# Patient Record
Sex: Male | Born: 1990 | Race: Black or African American | Hispanic: No | Marital: Single
Health system: Southern US, Community
[De-identification: ages and names within clinical notes are randomized; demographics above are authoritative.]

## PROBLEM LIST (undated history)

## (undated) DIAGNOSIS — F1010 Alcohol abuse, uncomplicated: Secondary | ICD-10-CM

---

## 2017-02-15 ENCOUNTER — Emergency Department (HOSPITAL_COMMUNITY)
Admission: EM | Admit: 2017-02-15 | Discharge: 2017-02-15 | Disposition: A | Discharge status: Home / Self Care | Payer: Self-pay | Attending: Emergency Medicine | Admitting: Emergency Medicine

## 2017-02-15 ENCOUNTER — Encounter (HOSPITAL_COMMUNITY): Payer: Self-pay

## 2017-02-15 DIAGNOSIS — F10920 Alcohol use, unspecified with intoxication, uncomplicated: Secondary | ICD-10-CM | POA: Insufficient documentation

## 2017-02-15 MED ORDER — AMMONIA AROMATIC IN INHA
RESPIRATORY_TRACT | Status: AC
  Filled 2017-02-15: qty 10

## 2017-02-15 NOTE — ED Triage Notes (Addendum)
Per EMS, they were called to the [facility] on [address] d/t Pt sleeping at a table.  Pt admits to "drinking 1.5 Tags and smoking a blunt."  Denies pain.  Pt very lethargic.   Unable to keep Pt awake long enough to complete history assessment.

## 2017-02-15 NOTE — ED Provider Notes (Signed)
  [facility] DEPT Provider Note   CSN: [id number] Arrival date & time: 02/15/17  0407     History   Chief Complaint Chief Complaint  Patient presents with  . Alcohol Intoxication  . Marijuana Use    HPI David Burnett is a 26 y.o. male.  The history is provided by the EMS personnel. The history is limited by the condition of the patient.  Alcohol Intoxication  This is a new problem. The current episode started 3 to 5 hours ago. The problem occurs constantly. The problem has not changed since onset.Pertinent negatives include no chest pain, no headaches and no shortness of breath. Nothing aggravates the symptoms. Nothing relieves the symptoms. He has tried nothing for the symptoms. The treatment provided no relief.    History reviewed. No pertinent past medical history.  There are no active problems to display for this patient.   History reviewed. No pertinent surgical history.     Home Medications    Prior to Admission medications   Not on File    Family History No family history on file.  Social History Social History  Substance Use Topics  . Smoking status: Unknown If Ever Smoked  . Smokeless tobacco: Never Used  . Alcohol use Yes     Allergies   Patient has no allergy information on record.   Review of Systems Review of Systems  Eyes: Negative for photophobia.  Respiratory: Negative for shortness of breath.   Cardiovascular: Negative for chest pain.  Neurological: Negative for headaches.  All other systems reviewed and are negative.    Physical Exam Updated Vital Signs BP (!) 113/58 (BP Location: Left Arm)   Pulse 77   Temp 97.6 F (36.4 C) (Axillary)   Resp 20   SpO2 98%   Physical Exam  Constitutional: He appears well-developed and well-nourished. No distress.  HENT:  Head: Normocephalic and atraumatic.  Nose: Nose normal.  Mouth/Throat: No oropharyngeal exudate.  Eyes: Pupils are equal, round, and reactive to light. Conjunctivae  are normal.  Neck: Normal range of motion. Neck supple.  Cardiovascular: Normal rate, regular rhythm, normal heart sounds and intact distal pulses.   Pulmonary/Chest: Effort normal and breath sounds normal. He has no wheezes. He has no rales.  Abdominal: Soft. Bowel sounds are normal. He exhibits no mass. There is no tenderness. There is no rebound and no guarding.  Musculoskeletal: Normal range of motion.  Neurological: He is alert.  Skin: Skin is warm and dry. Capillary refill takes less than 2 seconds.  Psychiatric: He has a normal mood and affect.     ED Treatments / Results   Vitals:   02/15/17 0434  BP: (!) 113/58  Pulse: 77  Resp: 20  Temp: 97.6 F (36.4 C)  SpO2: 98%    Procedures Procedures (including critical care time)  Medications Ordered in ED Medications  ammonia inhalant (not administered)      Final Clinical Impressions(s) / ED Diagnoses  Patient will need to be alert and ambulate and tolerate oral liquids.     Missouri Lapaglia, MD 02/15/17 [phone]

## 2017-02-15 NOTE — ED Notes (Signed)
Assisted pt to restroom with unsteady gait noted and back to bed bed rails up.

## 2017-03-12 ENCOUNTER — Emergency Department (HOSPITAL_COMMUNITY): Payer: Self-pay

## 2017-03-12 ENCOUNTER — Emergency Department (HOSPITAL_COMMUNITY)
Admission: EM | Admit: 2017-03-12 | Discharge: 2017-03-12 | Disposition: A | Discharge status: Home / Self Care | Payer: Self-pay | Attending: Emergency Medicine | Admitting: Emergency Medicine

## 2017-03-12 ENCOUNTER — Encounter (HOSPITAL_COMMUNITY): Payer: Self-pay

## 2017-03-12 DIAGNOSIS — Y999 Unspecified external cause status: Secondary | ICD-10-CM | POA: Insufficient documentation

## 2017-03-12 DIAGNOSIS — Y9302 Activity, running: Secondary | ICD-10-CM | POA: Insufficient documentation

## 2017-03-12 DIAGNOSIS — S6992XA Unspecified injury of left wrist, hand and finger(s), initial encounter: Secondary | ICD-10-CM | POA: Insufficient documentation

## 2017-03-12 DIAGNOSIS — Y929 Unspecified place or not applicable: Secondary | ICD-10-CM | POA: Insufficient documentation

## 2017-03-12 DIAGNOSIS — W010XXA Fall on same level from slipping, tripping and stumbling without subsequent striking against object, initial encounter: Secondary | ICD-10-CM | POA: Insufficient documentation

## 2017-03-12 MED ORDER — IBUPROFEN 800 MG PO TABS: 800.0000 mg | ORAL_TABLET | Freq: Three times a day (TID) | ORAL | 0 refills | Status: DC

## 2017-03-12 NOTE — ED Triage Notes (Signed)
Patient complains of left wrist pain after falling last night, pain with ROM, no deformity noted

## 2017-03-12 NOTE — ED Provider Notes (Signed)
[facility] EMERGENCY DEPARTMENT Provider Note   CSN: [id number] Arrival date & time: 03/12/17  1217  History   Chief Complaint No chief complaint on file.   HPI David Burnett is a 26 y.o. male.  HPI   Patient comes to the ER with complaints of left wrist injury after falling on it while running to catch the bus and fell on it. He has been drinking milk for the pain but it has not been helping. He has not tried any medication. He works at [employer] and [employer] and uses both hands, he is right hand dominant. He is tender t the outside and has pain with moving his wrist. No weakness or numbness. No laceration or abrasion.  History reviewed. No pertinent past medical history.  There are no active problems to display for this patient.   History reviewed. No pertinent surgical history.     Home Medications    Prior to Admission medications   Medication Sig Start Date End Date Taking? Authorizing Provider  ibuprofen (ADVIL,MOTRIN) 800 MG tablet Take 1 tablet (800 mg total) by mouth 3 (three) times daily. 03/12/17   Delos Haring, PA-C    Family History No family history on file.  Social History Social History  Substance Use Topics  . Smoking status: Unknown If Ever Smoked  . Smokeless tobacco: Never Used  . Alcohol use Yes     Allergies   Patient has no known allergies.   Review of Systems Review of Systems  Negative ROS aside from pertinent positives and negatives as listed in HPI  Physical Exam Updated Vital Signs BP 122/71 (BP Location: Left Arm)   Pulse 89   Temp (!) 97.5 F (36.4 C) (Oral)   Resp 20   SpO2 100%   Physical Exam  Constitutional: He appears well-developed and well-nourished. No distress.  HENT:  Head: Normocephalic and atraumatic.  Eyes: Pupils are equal, round, and reactive to light.  Neck: Normal range of motion. Neck supple.  Cardiovascular: Normal rate and regular rhythm.   Pulmonary/Chest: Effort normal.   Abdominal: Soft.  Musculoskeletal:       Left wrist: He exhibits decreased range of motion, tenderness, bony tenderness and swelling. He exhibits no effusion, no crepitus, no deformity and no laceration.  Intact sensation, strong radial pulse. He does have pain with range of motion   Neurological: He is alert.  Skin: Skin is warm and dry.  Nursing note and vitals reviewed.   ED Treatments / Results  Labs (all labs ordered are listed, but only abnormal results are displayed) Labs Reviewed - No data to display  EKG  EKG Interpretation None       Radiology Dg Wrist Complete Left  Result Date: 03/12/2017 CLINICAL DATA:  Radial wrist pain after fall. EXAM: LEFT WRIST - COMPLETE 3+ VIEW COMPARISON:  None. FINDINGS: There is no evidence of fracture or dislocation. There is no evidence of arthropathy or other focal bone abnormality. Lunotriquetral coalition. Soft tissues are unremarkable. IMPRESSION: Negative. If there is continued clinical concern for occult scaphoid fracture, recommend follow up x-rays in 10-14 days. Electronically Signed   By: Titus Dubin M.D.   On: 03/12/2017 12:57    Procedures Procedures (including critical care time)  Medications Ordered in ED Medications - No data to display   Initial Impression / Assessment and Plan / ED Course  I have reviewed the triage vital signs and the nursing notes.  Pertinent labs & imaging results that were available  during my care of the patient were reviewed by me and considered in my medical decision making (see chart for details).    No pain to the snuff box region. xrays are negative for fracture. Will place In wrist splint and refer to Ortho. Recommend RICE as well as Tylenol or Ibuprofen.  Blood pressure 122/71, pulse 89, temperature (!) 97.5 F (36.4 C), temperature source Oral, resp. rate 20, SpO2 100 %.  David Burnett has been evaluated today in the emergency department. The appropriate screening and testing was  been performed and I believe the patient to be medically stable for discharge.   Return signs and symptoms have been discussed with the patient and/or caregivers and they have voiced their understanding. The patient has agreed to follow-up with their primary care provider or the referred specialist.   Final Clinical Impressions(s) / ED Diagnoses   Final diagnoses:  Injury of left wrist, initial encounter    New Prescriptions New Prescriptions   IBUPROFEN (ADVIL,MOTRIN) 800 MG TABLET    Take 1 tablet (800 mg total) by mouth 3 (three) times daily.     Delos Haring, PA-C 03/12/17 1557    Lajean Saver, MD 03/13/17 [phone]

## 2017-03-20 ENCOUNTER — Other Ambulatory Visit (HOSPITAL_COMMUNITY): Payer: Self-pay

## 2017-03-20 ENCOUNTER — Observation Stay (HOSPITAL_COMMUNITY)
Admission: EM | Admit: 2017-03-20 | Discharge: 2017-03-21 | Disposition: A | Discharge status: Home / Self Care | Payer: Self-pay | Attending: Internal Medicine | Admitting: Internal Medicine

## 2017-03-20 ENCOUNTER — Emergency Department (HOSPITAL_COMMUNITY): Payer: Self-pay

## 2017-03-20 ENCOUNTER — Observation Stay (HOSPITAL_COMMUNITY): Payer: Self-pay

## 2017-03-20 ENCOUNTER — Other Ambulatory Visit: Payer: Self-pay

## 2017-03-20 ENCOUNTER — Encounter (HOSPITAL_COMMUNITY): Payer: Self-pay | Admitting: Emergency Medicine

## 2017-03-20 DIAGNOSIS — G92 Toxic encephalopathy: Secondary | ICD-10-CM | POA: Insufficient documentation

## 2017-03-20 DIAGNOSIS — M6282 Rhabdomyolysis: Secondary | ICD-10-CM | POA: Insufficient documentation

## 2017-03-20 DIAGNOSIS — F17210 Nicotine dependence, cigarettes, uncomplicated: Secondary | ICD-10-CM | POA: Insufficient documentation

## 2017-03-20 DIAGNOSIS — F10929 Alcohol use, unspecified with intoxication, unspecified: Secondary | ICD-10-CM

## 2017-03-20 DIAGNOSIS — E872 Acidosis: Secondary | ICD-10-CM | POA: Insufficient documentation

## 2017-03-20 DIAGNOSIS — F10129 Alcohol abuse with intoxication, unspecified: Secondary | ICD-10-CM | POA: Insufficient documentation

## 2017-03-20 DIAGNOSIS — Z7982 Long term (current) use of aspirin: Secondary | ICD-10-CM | POA: Insufficient documentation

## 2017-03-20 DIAGNOSIS — T510X1A Toxic effect of ethanol, accidental (unintentional), initial encounter: Principal | ICD-10-CM | POA: Insufficient documentation

## 2017-03-20 DIAGNOSIS — Z79899 Other long term (current) drug therapy: Secondary | ICD-10-CM | POA: Insufficient documentation

## 2017-03-20 DIAGNOSIS — I959 Hypotension, unspecified: Secondary | ICD-10-CM

## 2017-03-20 DIAGNOSIS — G9340 Encephalopathy, unspecified: Secondary | ICD-10-CM | POA: Diagnosis present

## 2017-03-20 DIAGNOSIS — D1802 Hemangioma of intracranial structures: Secondary | ICD-10-CM | POA: Insufficient documentation

## 2017-03-20 DIAGNOSIS — Y9289 Other specified places as the place of occurrence of the external cause: Secondary | ICD-10-CM | POA: Insufficient documentation

## 2017-03-20 LAB — GLUCOSE, CAPILLARY
GLUCOSE-CAPILLARY: 157 mg/dL — AB (ref 65–99)
GLUCOSE-CAPILLARY: 91 mg/dL (ref 65–99)
Glucose-Capillary: 116 mg/dL — ABNORMAL HIGH (ref 65–99)
Glucose-Capillary: 160 mg/dL — ABNORMAL HIGH (ref 65–99)
Glucose-Capillary: 77 mg/dL (ref 65–99)
Glucose-Capillary: 79 mg/dL (ref 65–99)

## 2017-03-20 LAB — RAPID URINE DRUG SCREEN, HOSP PERFORMED
Amphetamines: NOT DETECTED
BARBITURATES: NOT DETECTED
BENZODIAZEPINES: NOT DETECTED
Cocaine: NOT DETECTED
Opiates: NOT DETECTED
TETRAHYDROCANNABINOL: NOT DETECTED

## 2017-03-20 LAB — COMPREHENSIVE METABOLIC PANEL
ALK PHOS: 52 U/L (ref 38–126)
ALT: 34 U/L (ref 17–63)
AST: 47 U/L — AB (ref 15–41)
Albumin: 4.5 g/dL (ref 3.5–5.0)
Anion gap: 11 (ref 5–15)
BILIRUBIN TOTAL: 0.5 mg/dL (ref 0.3–1.2)
BUN: 20 mg/dL (ref 6–20)
CO2: 26 mmol/L (ref 22–32)
Calcium: 9.2 mg/dL (ref 8.9–10.3)
Chloride: 105 mmol/L (ref 101–111)
Creatinine, Ser: 1.07 mg/dL (ref 0.61–1.24)
GFR calc Af Amer: >60 mL/min (ref >60)
GFR calc non Af Amer: >60 mL/min (ref >60)
GLUCOSE: 78 mg/dL (ref 65–99)
POTASSIUM: 3.8 mmol/L (ref 3.5–5.1)
Sodium: 142 mmol/L (ref 135–145)
TOTAL PROTEIN: 7.8 g/dL (ref 6.5–8.1)

## 2017-03-20 LAB — LACTIC ACID, PLASMA
LACTIC ACID, VENOUS: 3.1 mmol/L — AB (ref 0.5–1.9)
Lactic Acid, Venous: 3 mmol/L (ref 0.5–1.9)

## 2017-03-20 LAB — CBC WITH DIFFERENTIAL/PLATELET
BASOS ABS: 0 10*3/uL (ref 0.0–0.1)
BASOS PCT: 0 %
EOS PCT: 1 %
Eosinophils Absolute: 0.1 10*3/uL (ref 0.0–0.7)
HCT: 41.5 % (ref 39.0–52.0)
Hemoglobin: 13.9 g/dL (ref 13.0–17.0)
LYMPHS PCT: 19 %
Lymphs Abs: 1.5 10*3/uL (ref 0.7–4.0)
MCH: 32.1 pg (ref 26.0–34.0)
MCHC: 33.5 g/dL (ref 30.0–36.0)
MCV: 95.8 fL (ref 78.0–100.0)
Monocytes Absolute: 0.4 10*3/uL (ref 0.1–1.0)
Monocytes Relative: 6 %
Neutro Abs: 5.6 10*3/uL (ref 1.7–7.7)
Neutrophils Relative %: 74 %
PLATELETS: 267 10*3/uL (ref 150–400)
RBC: 4.33 MIL/uL (ref 4.22–5.81)
RDW: 14 % (ref 11.5–15.5)
WBC: 7.7 10*3/uL (ref 4.0–10.5)

## 2017-03-20 LAB — URINALYSIS, ROUTINE W REFLEX MICROSCOPIC
Bilirubin Urine: NEGATIVE
Glucose, UA: NEGATIVE mg/dL
HGB URINE DIPSTICK: NEGATIVE
Ketones, ur: NEGATIVE mg/dL
LEUKOCYTES UA: NEGATIVE
NITRITE: NEGATIVE
PROTEIN: NEGATIVE mg/dL
SPECIFIC GRAVITY, URINE: 1.014 (ref 1.005–1.030)
pH: 5 (ref 5.0–8.0)

## 2017-03-20 LAB — HEPATIC FUNCTION PANEL
ALBUMIN: 3.9 g/dL (ref 3.5–5.0)
ALK PHOS: 46 U/L (ref 38–126)
ALT: 32 U/L (ref 17–63)
AST: 49 U/L — ABNORMAL HIGH (ref 15–41)
BILIRUBIN TOTAL: 0.4 mg/dL (ref 0.3–1.2)
Bilirubin, Direct: <0.1 mg/dL — ABNORMAL LOW (ref 0.1–0.5)
TOTAL PROTEIN: 6.8 g/dL (ref 6.5–8.1)

## 2017-03-20 LAB — MAGNESIUM: Magnesium: 1.8 mg/dL (ref 1.7–2.4)

## 2017-03-20 LAB — CK
CK TOTAL: 1402 U/L — AB (ref 49–397)
Total CK: 930 U/L — ABNORMAL HIGH (ref 49–397)

## 2017-03-20 LAB — CBC
HEMATOCRIT: 37.9 % — AB (ref 39.0–52.0)
HEMOGLOBIN: 12.4 g/dL — AB (ref 13.0–17.0)
MCH: 31.9 pg (ref 26.0–34.0)
MCHC: 32.7 g/dL (ref 30.0–36.0)
MCV: 97.4 fL (ref 78.0–100.0)
Platelets: 254 10*3/uL (ref 150–400)
RBC: 3.89 MIL/uL — ABNORMAL LOW (ref 4.22–5.81)
RDW: 14.2 % (ref 11.5–15.5)
WBC: 7.1 10*3/uL (ref 4.0–10.5)

## 2017-03-20 LAB — TSH: TSH: 0.213 u[IU]/mL — ABNORMAL LOW (ref 0.350–4.500)

## 2017-03-20 LAB — BASIC METABOLIC PANEL
Anion gap: 10 (ref 5–15)
BUN: 26 mg/dL — AB (ref 6–20)
CHLORIDE: 110 mmol/L (ref 101–111)
CO2: 22 mmol/L (ref 22–32)
CREATININE: 1 mg/dL (ref 0.61–1.24)
Calcium: 8.2 mg/dL — ABNORMAL LOW (ref 8.9–10.3)
GFR calc Af Amer: >60 mL/min (ref >60)
GFR calc non Af Amer: >60 mL/min (ref >60)
GLUCOSE: 58 mg/dL — AB (ref 65–99)
POTASSIUM: 3.5 mmol/L (ref 3.5–5.1)
Sodium: 142 mmol/L (ref 135–145)

## 2017-03-20 LAB — SALICYLATE LEVEL: Salicylate Lvl: <7 mg/dL (ref 2.8–30.0)

## 2017-03-20 LAB — HIV ANTIBODY (ROUTINE TESTING W REFLEX): HIV SCREEN 4TH GENERATION: NONREACTIVE

## 2017-03-20 LAB — TROPONIN I

## 2017-03-20 LAB — MRSA PCR SCREENING: MRSA BY PCR: NEGATIVE

## 2017-03-20 LAB — ACETAMINOPHEN LEVEL

## 2017-03-20 LAB — ETHANOL: Alcohol, Ethyl (B): 216 mg/dL — ABNORMAL HIGH (ref <10)

## 2017-03-20 LAB — AMMONIA: Ammonia: 26 umol/L (ref 9–35)

## 2017-03-20 MED ORDER — LORAZEPAM 2 MG/ML IJ SOLN: 1.0000 mg | Freq: Four times a day (QID) | INTRAMUSCULAR | Status: DC | PRN

## 2017-03-20 MED ORDER — LORAZEPAM 1 MG PO TABS: 1.0000 mg | ORAL_TABLET | Freq: Four times a day (QID) | ORAL | Status: DC | PRN

## 2017-03-20 MED ORDER — THIAMINE HCL 100 MG/ML IJ SOLN
100.0000 mg | Freq: Every day | INTRAMUSCULAR | Status: DC
  Administered 2017-03-20: 100 mg via INTRAVENOUS
  Filled 2017-03-20: qty 2

## 2017-03-20 MED ORDER — ONDANSETRON HCL 4 MG PO TABS: 4.0000 mg | ORAL_TABLET | Freq: Four times a day (QID) | ORAL | Status: DC | PRN

## 2017-03-20 MED ORDER — POTASSIUM CHLORIDE CRYS ER 20 MEQ PO TBCR
40.0000 meq | EXTENDED_RELEASE_TABLET | Freq: Once | ORAL | Status: AC
  Administered 2017-03-20: 40 meq via ORAL
  Filled 2017-03-20: qty 2

## 2017-03-20 MED ORDER — SODIUM CHLORIDE 0.9 % IV BOLUS (SEPSIS)
500.0000 mL | Freq: Once | INTRAVENOUS | Status: AC
  Administered 2017-03-20: 500 mL via INTRAVENOUS

## 2017-03-20 MED ORDER — ACETAMINOPHEN 650 MG RE SUPP: 650.0000 mg | Freq: Four times a day (QID) | RECTAL | Status: DC | PRN

## 2017-03-20 MED ORDER — ACETAMINOPHEN 325 MG PO TABS
650.0000 mg | ORAL_TABLET | Freq: Four times a day (QID) | ORAL | Status: DC | PRN
  Administered 2017-03-21: 650 mg via ORAL
  Filled 2017-03-20: qty 2

## 2017-03-20 MED ORDER — DEXTROSE 50 % IV SOLN
25.0000 mL | Freq: Once | INTRAVENOUS | Status: AC
  Administered 2017-03-20: 25 mL via INTRAVENOUS

## 2017-03-20 MED ORDER — VITAMIN B-1 100 MG PO TABS
100.0000 mg | ORAL_TABLET | Freq: Every day | ORAL | Status: DC
  Filled 2017-03-20: qty 1

## 2017-03-20 MED ORDER — SODIUM CHLORIDE 0.9 % IV SOLN
INTRAVENOUS | Status: DC
  Administered 2017-03-20 – 2017-03-21 (×4): via INTRAVENOUS

## 2017-03-20 MED ORDER — SODIUM CHLORIDE 0.9 % IV BOLUS (SEPSIS)
1000.0000 mL | Freq: Once | INTRAVENOUS | Status: AC
  Administered 2017-03-20: 1000 mL via INTRAVENOUS

## 2017-03-20 MED ORDER — FOLIC ACID 1 MG PO TABS
1.0000 mg | ORAL_TABLET | Freq: Every day | ORAL | Status: DC
  Administered 2017-03-20: 1 mg via ORAL
  Filled 2017-03-20: qty 1

## 2017-03-20 MED ORDER — ONDANSETRON HCL 4 MG/2ML IJ SOLN: 4.0000 mg | Freq: Four times a day (QID) | INTRAMUSCULAR | Status: DC | PRN

## 2017-03-20 MED ORDER — ADULT MULTIVITAMIN W/MINERALS CH
1.0000 | ORAL_TABLET | Freq: Every day | ORAL | Status: DC
  Administered 2017-03-20: 1 via ORAL
  Filled 2017-03-20: qty 1

## 2017-03-20 MED ORDER — SODIUM CHLORIDE 0.9 % IV BOLUS (SEPSIS)
2000.0000 mL | Freq: Once | INTRAVENOUS | Status: AC
  Administered 2017-03-20: 2000 mL via INTRAVENOUS

## 2017-03-20 MED ORDER — DEXTROSE 50 % IV SOLN
INTRAVENOUS | Status: AC
  Administered 2017-03-20: 25 mL
  Filled 2017-03-20: qty 50

## 2017-03-20 NOTE — ED Triage Notes (Signed)
Patient BIB [facility]. Pt was found passed out in bathroom floor of gas station. Gas station employee states he doesn't know how long pt was in restroom, possibly since before 1800. Pt responds only to painful stimulus, pupils are pinpoint and sluggish.

## 2017-03-20 NOTE — Progress Notes (Signed)
MRI contacted, trying to schedule appointment for MRI. MRI unable to take patient at this time. Will continue to follow up.   Naomie Dean, RN

## 2017-03-20 NOTE — Progress Notes (Signed)
CRITICAL VALUE ALERT  Critical Value: Lactic acid 3.0   Date & Time Notied:  03/20/2017 0744H  Provider Notified: Yes  Orders Received/Actions taken: Follow-up lactic acid placed by MD.   Will continue to monitor.  Naomie Dean, RN

## 2017-03-20 NOTE — Progress Notes (Signed)
Received patient from Ed at [facility].  Obtunded, pinpoint pupils b/p 84/30 aroused to deep sternal rub.  Rapid response called with subsequent transfer to ICU

## 2017-03-20 NOTE — ED Provider Notes (Signed)
[facility] DEPT Provider Note   CSN: [id number] Arrival date & time: 03/20/17  0017     History   Chief Complaint Chief Complaint  Patient presents with  . Alcohol Intoxication    HPI David Burnett is a 26 y.o. male.  The history is provided by the EMS personnel.  Alcohol Intoxication      Level 5 caveat: Intoxication Per EMS patient was found passed out in the bathroom of a gas station.  Employee there states he came in at 6 PM and he never saw the patient come in so not entirely sure how long he was in there.  Patient was found with alcohol around him, question if drugs on board as well.  Patient is somnolent and drowsy appearing.  He does respond to painful stimuli.  No signs of head trauma.  History reviewed. No pertinent past medical history.  There are no active problems to display for this patient.   History reviewed. No pertinent surgical history.     Home Medications    Prior to Admission medications   Medication Sig Start Date End Date Taking? Authorizing Provider  ibuprofen (ADVIL,MOTRIN) 800 MG tablet Take 1 tablet (800 mg total) by mouth 3 (three) times daily. 03/12/17   Delos Haring, PA-C    Family History History reviewed. No pertinent family history.  Social History Social History  Substance Use Topics  . Smoking status: Unknown If Ever Smoked  . Smokeless tobacco: Never Used  . Alcohol use Yes     Allergies   Patient has no known allergies.   Review of Systems Review of Systems  Unable to perform ROS: Other     Physical Exam Updated Vital Signs BP 120/69 (BP Location: Right Arm)   Pulse 66   Resp 14   SpO2 93%   Physical Exam  Constitutional: He appears well-developed and well-nourished.  Smells of alcohol  HENT:  Head: Normocephalic and atraumatic.  Mouth/Throat: Oropharynx is clear and moist.  No signs of head trauma  Eyes: Pupils are equal, round, and reactive to light. Conjunctivae  and EOM are normal.  Pupils pinpoint bilaterally  Neck: Normal range of motion.  Cardiovascular: Normal rate, regular rhythm and normal heart sounds.   Pulmonary/Chest: Effort normal and breath sounds normal. No respiratory distress. He has no wheezes.  Breathing easily, lungs clear  Abdominal: Soft. Bowel sounds are normal.  Musculoskeletal: Normal range of motion.  Neurological:  Drowsy, sleeping, does respond to painful stimuli and will withdraw extremities from pain  Skin: Skin is warm and dry.  Nursing note and vitals reviewed.    ED Treatments / Results  Labs (all labs ordered are listed, but only abnormal results are displayed) Labs Reviewed  COMPREHENSIVE METABOLIC PANEL - Abnormal; Notable for the following:       Result Value   AST 47 (*)    All other components within normal limits  ETHANOL - Abnormal; Notable for the following:    Alcohol, Ethyl (B) 216 (*)    All other components within normal limits  ACETAMINOPHEN LEVEL - Abnormal; Notable for the following:    Acetaminophen (Tylenol), Serum <10 (*)    All other components within normal limits  CK - Abnormal; Notable for the following:    Total CK 1,402 (*)    All other components within normal limits  CBC WITH DIFFERENTIAL/PLATELET  SALICYLATE LEVEL  RAPID URINE DRUG SCREEN, HOSP PERFORMED    EKG  EKG Interpretation None  Radiology Ct Head Wo Contrast  Result Date: 03/20/2017 CLINICAL DATA:  Passed out on a bathroom floor IV gas station. Intoxication. EXAM: CT HEAD WITHOUT CONTRAST TECHNIQUE: Contiguous axial images were obtained from the base of the skull through the vertex without intravenous contrast. COMPARISON:  None. FINDINGS: Brain: No evidence of acute infarction, hemorrhage, hydrocephalus, extra-axial collection or mass effect. Faint linear hyperdensity in the right occipital lobe, likely a developmental venous anomaly. Vascular: No hyperdense vessel. Skull: No fracture or focal lesion.  Sinuses/Orbits: Paranasal sinuses and mastoid air cells are clear. The visualized orbits are unremarkable. Other: None. IMPRESSION: Faint linear hyperdensity in the right occipital lobe is likely a developmental venous anomaly. This is likely incidental, however MRI/MRA would provide more detailed evaluation. Electronically Signed   By: Jeb Levering M.D.   On: 03/20/2017 03:13    Procedures Procedures (including critical care time)  Medications Ordered in ED Medications - No data to display   Initial Impression / Assessment and Plan / ED Course  I have reviewed the triage vital signs and the nursing notes.  Pertinent labs & imaging results that were available during my care of the patient were reviewed by me and considered in my medical decision making (see chart for details).  26 year old male brought here by EMS after being found passed out on the floor of a gas station.  Unknown downtime.  He was found with alcohol around him, question of other drugs on board.  Patient is very drowsy and somnolent here.  He does respond to painful stimuli and will withdraw his extremities from pain.  He has no signs of trauma on exam.  His vitals are stable.  He does smell of alcohol.  Screening lab work obtained ethanol 216.  Given unknown downtime, CK was sent which is over 1400.  IVF boluses ordered. Given patient's current state of intoxication, I do feel he will require close monitoring.  I discussed with hospitalist, Dr. Delford Field, who requested CT of the head which was ordered-- no acute traumatic injuries.  He will admit for ongoing care.  Final Clinical Impressions(s) / ED Diagnoses   Final diagnoses:  Alcoholic intoxication with complication [facility])  Non-traumatic rhabdomyolysis    New Prescriptions New Prescriptions   No medications on file     Kathryne Hitch 85/02/77 4128    Delora Fuel, MD 78/67/67 2236

## 2017-03-20 NOTE — Progress Notes (Signed)
Pt asked to notify family that he was here.  Hortense Ramal his mother was made aware that he is here in 1234.

## 2017-03-20 NOTE — Progress Notes (Signed)
CRITICAL VALUE ALERT  Critical Value: Lactic acid 3.1  Date & Time Noted: 03/20/2017 1000H  Provider Notified: Yes  Orders Received/Actions taken: Orders received and carried out; sodium chloride 0.9 % bolus 500 mL once  Naomie Dean, RN

## 2017-03-20 NOTE — Care Management Note (Signed)
Case Management Note  Patient Details  Name: Karston Hyland MRN: [id number] Date of Birth: 05-01-1991  Subjective/Objective:                  ETOH abuse  Action/Plan: Date:  March 20, 2017 Chart reviewed for concurrent status and case management needs.  Will continue to follow patient progress.  Discharge Planning: following for needs  Expected discharge date: March 23, 2017  Velva Harman, BSN, [place], [place]   Expected Discharge Date:                  Expected Discharge Plan:  Home/Self Care  In-House Referral:     Discharge planning Services  CM Consult  Post Acute Care Choice:    Choice offered to:     DME Arranged:    DME Agency:     HH Arranged:    [facility] Agency:     Status of Service:  In process, will continue to follow  If discussed at Long Length of Stay Meetings, dates discussed:    Additional Comments:  Leeroy Cha, RN 03/20/2017, 9:13 AM

## 2017-03-20 NOTE — Significant Event (Signed)
Rapid Response Event Note  Overview: Time Called: 4158 Arrival Time: 0546 Event Type: Hypotension  Initial Focused Assessment: 71 male lying in bed, obtunded, BP 70's palpated.  Pt aroused to deep sternal rub.  VS as listed in flow sheet.  MD arrives, decision to made to transfer to SDU to monitor pt BP.     Interventions: above  Plan of Care (if not transferred): tx to sdu  Event Summary:   at      at          [facility]

## 2017-03-20 NOTE — Progress Notes (Signed)
[facility] PROGRESS NOTE  David Burnett JQB:[id number] DOB: 1990-09-22 DOA: 03/20/2017  PCP: Patient, No Pcp Per  Brief History/Interval Summary: 26 year old African-American male was brought into the EMS from a gas station where he was found to be passed out in the bathroom.  Patient had multiple bottles of alcohol around him.  He was noted to be intoxicated.  He was difficult to arouse and had low blood pressures.  Hence he was hospitalized for further management.  Reason for Visit: Acute alcoholic intoxication.  Consultants: None  Procedures: None  Antibiotics: None  Subjective/Interval History: Patient somnolent this morning but easily arousable.  Denies any headaches and moving all his extremities.  ROS: Denies any nausea or vomiting  Objective:  Vital Signs  Vitals:   03/20/17 0800 03/20/17 0900 03/20/17 1000 03/20/17 1032  BP: (!) 102/41 (!) 109/46 (!) 123/29   Pulse: 85 82 100   Resp: 15 18 18    Temp: (!) 97.4 F (36.3 C)     TempSrc: Oral     SpO2: 99% 99% 97% 99%  Weight:      Height:        Intake/Output Summary (Last 24 hours) at 03/20/17 1158 Last data filed at 03/20/17 1100  Gross per 24 hour  Intake                0 ml  Output              500 ml  Net             -500 ml   Filed Weights   03/20/17 0600  Weight: 67.1 kg (147 lb 14.9 oz)    General appearance: cooperative and no distress Head: Normocephalic, without obvious abnormality, atraumatic Resp: clear to auscultation bilaterally Cardio: regular rate and rhythm, S1, S2 normal, no murmur, click, rub or gallop GI: soft, non-tender; bowel sounds normal; no masses,  no organomegaly Extremities: extremities normal, atraumatic, no cyanosis or edema Neurologic: Awake and alert.  No focal neurological deficits.  Lab Results:  Data Reviewed: I have personally reviewed following labs and imaging studies  CBC:  Recent Labs Lab 03/20/17 0058 03/20/17 0633  WBC 7.7 7.1  NEUTROABS  5.6  --   HGB 13.9 12.4*  HCT 41.5 37.9*  MCV 95.8 97.4  PLT 267 409    Basic Metabolic Panel:  Recent Labs Lab 03/20/17 0058 03/20/17 0633  NA 142 142  K 3.8 3.5  CL 105 110  CO2 26 22  GLUCOSE 78 58*  BUN 20 26*  CREATININE 1.07 1.00  CALCIUM 9.2 8.2*  MG  --  1.8    GFR: Estimated Creatinine Clearance: 95.7 mL/min (by C-G formula based on SCr of 1 mg/dL).  Liver Function Tests:  Recent Labs Lab 03/20/17 0058 03/20/17 0633  AST 47* 49*  ALT 34 32  ALKPHOS 52 46  BILITOT 0.5 0.4  PROT 7.8 6.8  ALBUMIN 4.5 3.9     Recent Labs Lab 03/20/17 0633  AMMONIA 26    Cardiac Enzymes:  Recent Labs Lab 03/20/17 0058 03/20/17 0633  CKTOTAL 1,402* 930*  TROPONINI  --  <0.03    CBG:  Recent Labs Lab 03/20/17 0648 03/20/17 0804 03/20/17 1137  GLUCAP 157* 91 79    Thyroid Function Tests:  Recent Labs  03/20/17 0633  TSH 0.213*     Recent Results (from the past 240 hour(s))  MRSA PCR Screening     Status: None   Collection Time: 03/20/17  6:07 AM  Result Value Ref Range Status   MRSA by PCR NEGATIVE NEGATIVE Final    Comment:        The GeneXpert MRSA Assay (FDA approved for NASAL specimens only), is one component of a comprehensive MRSA colonization surveillance program. It is not intended to diagnose MRSA infection nor to guide or monitor treatment for MRSA infections.       Radiology Studies: Ct Head Wo Contrast  Result Date: 03/20/2017 CLINICAL DATA:  Passed out on a bathroom floor IV gas station. Intoxication. EXAM: CT HEAD WITHOUT CONTRAST TECHNIQUE: Contiguous axial images were obtained from the base of the skull through the vertex without intravenous contrast. COMPARISON:  None. FINDINGS: Brain: No evidence of acute infarction, hemorrhage, hydrocephalus, extra-axial collection or mass effect. Faint linear hyperdensity in the right occipital lobe, likely a developmental venous anomaly. Vascular: No hyperdense vessel. Skull: No  fracture or focal lesion. Sinuses/Orbits: Paranasal sinuses and mastoid air cells are clear. The visualized orbits are unremarkable. Other: None. IMPRESSION: Faint linear hyperdensity in the right occipital lobe is likely a developmental venous anomaly. This is likely incidental, however MRI/MRA would provide more detailed evaluation. Electronically Signed   By: Jeb Levering M.D.   On: 03/20/2017 03:13   Dg Chest Port 1 View  Result Date: 03/20/2017 CLINICAL DATA:  Hypotension. EXAM: PORTABLE CHEST 1 VIEW COMPARISON:  None. FINDINGS: The heart size and mediastinal contours are within normal limits. Both lungs are clear. No pneumothorax or pleural effusion is noted. The visualized skeletal structures are unremarkable. IMPRESSION: No acute cardiopulmonary abnormality seen. Electronically Signed   By: Marijo Conception, M.D.   On: 03/20/2017 07:24     Medications:  Scheduled: . folic acid  1 mg Oral Daily  . multivitamin with minerals  1 tablet Oral Daily  . thiamine  100 mg Oral Daily   Or  . thiamine  100 mg Intravenous Daily   Continuous: . sodium chloride 125 mL/hr at 03/20/17 1142   VOH:[id number] **OR** acetaminophen, LORazepam **OR** LORazepam, ondansetron **OR** ondansetron (ZOFRAN) IV  Assessment/Plan:  Principal Problem:   Acute encephalopathy Active Problems:   Non-traumatic rhabdomyolysis   Alcoholic intoxication with complication (HCC)    Acute toxic encephalopathy most likely due to alcohol intoxication Mental status is improving.  Continue to monitor closely.  Hypotension likely due to hypovolemia Patient was noted to have low blood pressure when initially evaluated.  Blood pressure was low probably due to an element of hypovolemia.  Blood pressures have improved.  Continue IV fluids for now.  There is no evidence for any infection so sepsis is unlikely.  Lactic acidosis This is due to alcohol intake.  He will be given fluids.  Mild rhabdomyolysis CK was  noted to be mildly elevated.  Improving with IV fluids.  Low TSH Significance is unclear.  We will check free T4.  Abnormal CT head MRI brain and MRA head is pending.  Patient does not have any focal neurological deficits.  Acute alcohol intoxication/hypoglycemia He will need counseling.  Continue thiamine, folic acid.  Magnesium 1.8.  Replace potassium.  Ativan as needed.  Glucose noted to be low.  Initiate diet.   DVT Prophylaxis: SCDs    Code Status: Full code Family Communication: No family at bedside Disposition Plan: Management as outlined above.  Mobilize as tolerated.    LOS: 0 days   [facility] Pager [phone] 03/20/2017, 11:58 AM  If 7PM-7AM, please contact night-coverage at [email or web address], password [facility]

## 2017-03-20 NOTE — H&P (Signed)
History and Physical    David Burnett FBP:[id number] DOB: 1991/02/15 DOA: 03/20/2017  PCP: Patient, No Pcp Per  Patient coming from: Brought in from gas station.  Chief Complaint: Altered mental status.  HPI: David Burnett is a 26 y.o. male with history not much known because patient is encephalopathic was brought in from gas station after patient was found to be passed out in the bathroom of the gas station.  It is not known how long he has been unconscious.  Reviewing chart patient has had previous episodes like this.  ED Course: In the ER patient is encephalopathic with minimal response to questions with pinpoint pupils bilaterally.  CT head was showing some linear hyperdensity in the occipital area which may be congenital.  Alcohol levels are more than 200.  Salicylate Tylenol level and urine drug screen were negative.  At the time of my exam patient was mildly hypotensive but was stating his name and was able to count my fingers.  But very lethargic.  Patient is CK level is elevated at 1400.  He was given a fluid bolus and admitted for further management of acute encephalopathy likely from alcohol intoxication.  Review of Systems: As per HPI, rest all negative.   History reviewed. No pertinent past medical history.  History reviewed. No pertinent surgical history.   has an unknown smoking status. He has never used smokeless tobacco. He reports that he drinks alcohol. He reports that he uses drugs, including Marijuana.  No Known Allergies  Family History  Problem Relation Age of Onset  . Family history unknown: Yes    Prior to Admission medications   Medication Sig Start Date End Date Taking? Authorizing Provider  aspirin 325 MG tablet Take 325 mg by mouth every 6 (six) hours as needed for moderate pain.   Yes [provider]  ibuprofen (ADVIL,MOTRIN) 800 MG tablet Take 1 tablet (800 mg total) by mouth 3 (three) times daily. Patient taking differently: Take 800 mg by  mouth every 8 (eight) hours as needed for moderate pain.  03/12/17  Yes Delos Haring, PA-C    Physical Exam: Vitals:   03/20/17 0524 03/20/17 0547 03/20/17 0549 03/20/17 0600  BP: (!) 84/35  (!) 106/44 (!) 104/42  Pulse:  84    Resp:  16    Temp:      TempSrc:      SpO2:      Weight:    67.1 kg (147 lb 14.9 oz)  Height:    5\' 5"  (1.651 m)      Constitutional: Moderately built and nourished. Vitals:   03/20/17 0524 03/20/17 0547 03/20/17 0549 03/20/17 0600  BP: (!) 84/35  (!) 106/44 (!) 104/42  Pulse:  84    Resp:  16    Temp:      TempSrc:      SpO2:      Weight:    67.1 kg (147 lb 14.9 oz)  Height:    5\' 5"  (1.651 m)   Eyes: Anicteric no pallor. ENMT: No discharge from the ears eyes nose or mouth. Neck: No mass felt.  No neck rigidity.  No JVD appreciated. Respiratory: No rhonchi or crepitations. Cardiovascular: S1-S2 heard no murmurs appreciated. Abdomen: Soft nontender bowel sounds present. Musculoskeletal: No edema.  No joint effusion. Skin: No rash. Neurologic: Patient is lethargic oriented to his name but easily falls asleep.  Pupils are reacting but pinpoint. Psychiatric: Patient is lethargic.   Labs on Admission: I have personally reviewed  following labs and imaging studies  CBC:  Recent Labs Lab 03/20/17 0058  WBC 7.7  NEUTROABS 5.6  HGB 13.9  HCT 41.5  MCV 95.8  PLT 213   Basic Metabolic Panel:  Recent Labs Lab 03/20/17 0058  NA 142  K 3.8  CL 105  CO2 26  GLUCOSE 78  BUN 20  CREATININE 1.07  CALCIUM 9.2   GFR: Estimated Creatinine Clearance: 89.4 mL/min (by C-G formula based on SCr of 1.07 mg/dL). Liver Function Tests:  Recent Labs Lab 03/20/17 0058  AST 47*  ALT 34  ALKPHOS 52  BILITOT 0.5  PROT 7.8  ALBUMIN 4.5   No results for input(s): LIPASE, AMYLASE in the last 168 hours. No results for input(s): AMMONIA in the last 168 hours. Coagulation Profile: No results for input(s): INR, PROTIME in the last 168  hours. Cardiac Enzymes:  Recent Labs Lab 03/20/17 0058  CKTOTAL 1,402*   BNP (last 3 results) No results for input(s): PROBNP in the last 8760 hours. HbA1C: No results for input(s): HGBA1C in the last 72 hours. CBG: No results for input(s): GLUCAP in the last 168 hours. Lipid Profile: No results for input(s): CHOL, HDL, LDLCALC, TRIG, CHOLHDL, LDLDIRECT in the last 72 hours. Thyroid Function Tests: No results for input(s): TSH, T4TOTAL, FREET4, T3FREE, THYROIDAB in the last 72 hours. Anemia Panel: No results for input(s): VITAMINB12, FOLATE, FERRITIN, TIBC, IRON, RETICCTPCT in the last 72 hours. Urine analysis: No results found for: COLORURINE, APPEARANCEUR, LABSPEC, PHURINE, GLUCOSEU, HGBUR, BILIRUBINUR, KETONESUR, PROTEINUR, UROBILINOGEN, NITRITE, LEUKOCYTESUR Sepsis Labs: @LABRCNTIP (procalcitonin:4,lacticidven:4) )No results found for this or any previous visit (from the past 240 hour(s)).   Radiological Exams on Admission: Ct Head Wo Contrast  Result Date: 03/20/2017 CLINICAL DATA:  Passed out on a bathroom floor IV gas station. Intoxication. EXAM: CT HEAD WITHOUT CONTRAST TECHNIQUE: Contiguous axial images were obtained from the base of the skull through the vertex without intravenous contrast. COMPARISON:  None. FINDINGS: Brain: No evidence of acute infarction, hemorrhage, hydrocephalus, extra-axial collection or mass effect. Faint linear hyperdensity in the right occipital lobe, likely a developmental venous anomaly. Vascular: No hyperdense vessel. Skull: No fracture or focal lesion. Sinuses/Orbits: Paranasal sinuses and mastoid air cells are clear. The visualized orbits are unremarkable. Other: None. IMPRESSION: Faint linear hyperdensity in the right occipital lobe is likely a developmental venous anomaly. This is likely incidental, however MRI/MRA would provide more detailed evaluation. Electronically Signed   By: Jeb Levering M.D.   On: 03/20/2017 03:13      Assessment/Plan Principal Problem:   Acute encephalopathy Active Problems:   Non-traumatic rhabdomyolysis   Alcoholic intoxication with complication ([place])    1. Acute encephalopathy likely from alcohol intoxication -since patient's blood pressures are low normal and patient continues to remain obtunded we will monitor in stepdown.  EKG chest x-ray and UA are pending.  We will also check ammonia levels lactate levels and troponin.  Continue with hydration.  Thiamine.  For now I have kept patient on as needed Ativan but may need scheduled Ativan once patient is more alert and awake for alcohol withdrawal. 2. Abnormal CT head -CT head shows faint linear hypodensity in the right occipital lobe which could be a developmental venous anomaly.  As requested by the radiologist MRI/MRA brain has been ordered. 3. Alcohol intoxication -see #1.  We will need to get a further detailed history once patient is alert awake.   DVT prophylaxis: SCDs. Code Status: Full code. Family Communication: No family at the  bedside. Disposition Plan: To be determined. Consults called: None. Admission status: Observation.   Rise Patience MD [facility] Pager [phone].  If 7PM-7AM, please contact night-coverage [email or web address] Password [email or web address]  03/20/2017, 6:09 AM

## 2017-03-20 NOTE — ED Notes (Signed)
Bed: [facility] Expected date:  Expected time:  Means of arrival:  Comments: EMS intoxicated

## 2017-03-21 LAB — BASIC METABOLIC PANEL
ANION GAP: 6 (ref 5–15)
BUN: 14 mg/dL (ref 6–20)
CALCIUM: 8.9 mg/dL (ref 8.9–10.3)
CHLORIDE: 105 mmol/L (ref 101–111)
CO2: 25 mmol/L (ref 22–32)
Creatinine, Ser: 1.04 mg/dL (ref 0.61–1.24)
GFR calc non Af Amer: >60 mL/min (ref >60)
GLUCOSE: 89 mg/dL (ref 65–99)
Potassium: 3.8 mmol/L (ref 3.5–5.1)
Sodium: 136 mmol/L (ref 135–145)

## 2017-03-21 LAB — CBC
HEMATOCRIT: 38.2 % — AB (ref 39.0–52.0)
HEMOGLOBIN: 12.9 g/dL — AB (ref 13.0–17.0)
MCH: 32.3 pg (ref 26.0–34.0)
MCHC: 33.8 g/dL (ref 30.0–36.0)
MCV: 95.5 fL (ref 78.0–100.0)
Platelets: 244 10*3/uL (ref 150–400)
RBC: 4 MIL/uL — AB (ref 4.22–5.81)
RDW: 13.9 % (ref 11.5–15.5)
WBC: 6.3 10*3/uL (ref 4.0–10.5)

## 2017-03-21 LAB — TSH: TSH: 1.454 u[IU]/mL (ref 0.350–4.500)

## 2017-03-21 LAB — GLUCOSE, CAPILLARY
GLUCOSE-CAPILLARY: 87 mg/dL (ref 65–99)
GLUCOSE-CAPILLARY: 113 mg/dL — AB (ref 65–99)

## 2017-03-21 LAB — CK: CK TOTAL: 681 U/L — AB (ref 49–397)

## 2017-03-21 LAB — T4, FREE: Free T4: 0.78 ng/dL (ref 0.61–1.12)

## 2017-03-21 MED ORDER — ADULT MULTIVITAMIN W/MINERALS CH: 1.0000 | ORAL_TABLET | Freq: Every day | ORAL | 0 refills | Status: DC

## 2017-03-21 MED ORDER — RANITIDINE HCL 150 MG PO CAPS: 150.0000 mg | ORAL_CAPSULE | Freq: Two times a day (BID) | ORAL | 0 refills | Status: DC

## 2017-03-21 NOTE — Clinical Social Work Note (Signed)
CSW talked with patient earlier today regarding his alcohol consumption and substance abuse. Mr. Yohannes was very friendly, open and participated actively in the conversation. Patient reported that he is from [facility] [place], [place] and has lived briefly in [place] and came to [place] in July 2018. Mr. Roher acknowledged drinking and using drugs and reported that this primarily happens when he is bored. When asked patient did not directly respond to how often or how much drugs he uses or how much he consumed before being found unconscious. Mr. Dugger did report that he has been smoking cigarettes and drinking since the age of 48.  Mr. 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 Finding stable housing and getting settled in a church are very important to Mr. Corron and CSW acknowledged understanding and provided supportive feedback during the conversation.    CSW talked with patient about resources available in the community to provide temporary housing, food and other assistance as needed and information on [employer], [place] were given to patient, along with 4 bus passes to assist patient in getting to work or one of the community resources provided. Patient discharged earlier today, location unknown.  Dazja Houchin Givens, MSW, LCSW Licensed Clinical Social Worker [place] [phone]

## 2017-03-21 NOTE — Discharge Instructions (Signed)
Alcohol Intoxication °Alcohol intoxication occurs when a person no longer thinks clearly or functions well (becomes impaired) after drinking alcohol. Intoxication can occur with even one drink. The level of impairment depends on: °· The amount of alcohol the person had. °· The person's age, gender, and weight. °· How often the person drinks. °· Whether the person has other medical conditions, such as diabetes, seizures, or a heart condition. ° °Alcohol intoxication can range in severity from mild to severe. The condition can be dangerous, especially when caused by drinking large amounts of alcohol in a short period of time (binge drinking) or if the person also took certain prescription medicines or recreational drugs. °What are the signs or symptoms? °Symptoms of mild alcohol intoxication include: °· Feeling relaxed or sleepy. °· Mild difficulty with: °? Coordination. °? Speech. °? Memory. °? Attention. ° °Symptoms of moderate alcohol intoxication include: °· Extreme emotions, such as anger or sadness. °· Moderate difficulty with: °? Coordination. °? Speech. °? Memory. °? Attention. ° °Symptoms of severe alcohol intoxication include: °· Passing out. °· Vomiting. °· Confusion. °· Slow breathing. °· Coma. °· Severe difficulty with: °? Coordination. °? Speech. °? Memory. °? Attention. ° °Intoxication, especially in people who are not exposed to alcohol often can progress from mild to severe quickly, and may even cause coma or death. °How is this diagnosed? °This condition may be diagnosed based on: °· A medical history. °· A physical exam. °· A blood test that measures the concentration of alcohol in the blood (blood alcohol content, or BAC). °· Whether there is a smell of alcohol on the breath. ° °Your health care provider will ask you how much alcohol you drank and what kind of alcohol you had. °How is this treated? °Usually, treatment is not needed for this condition. Most of the effects of alcohol are temporary  and go away as the alcohol naturally leaves the body. Your health care provider may recommend monitoring until the alcohol level starts to drop and it is safe to go home. You may also get fluids through an IV tube to help prevent dehydration. If the intoxication is severe, a breathing machine called a ventilator may be needed to support your breathing. °Follow these instructions at home: °· Do not drive after drinking alcohol. °· Have someone stay with you while you are intoxicated. You should not be left alone. °· Stay hydrated. Drink enough fluid to keep your urine clear or pale yellow. °· Avoid caffeine because it can dehydrate you. °· Take over-the-counter and prescription medicines only as told by your health care provider. °How is this prevented? °To prevent alcohol intoxication: °· Limit alcohol intake to no more than 1 drink a day for nonpregnant women and 2 drinks a day for men. One drink equals 12 oz of beer, 5 oz of wine, or 1½ oz of hard liquor. °· Do not drink alcohol on an empty stomach. °· Avoid drinking alcohol if: °? You are under the legal drinking age. °? You are pregnant or may be pregnant. °? You are taking medicines that should not be taken with alcohol. °? Your drinking causes your medical condition to get worse. °? You need to drive or perform activities that require your attention. °? You have substance use disorder. ° °To prevent potentially serious complications of alcohol intoxication, seek immediate medical care if you or someone you know has signs of moderate or severe alcohol intoxication. These include: °· Moderate or severe difficulty with: °? Coordination. °? Speech. °?   Memory. °? Attention. °· Passing out. °· Confusion. °· Vomiting. ° °Do not leave someone alone if he or she is intoxicated. °Contact a health care provider if: °· You do not feel better after a few days. °· You are having problems at work, at school, or at home due to drinking. °Get help right away if: °· You become  shaky when you try to stop drinking. °· You shake uncontrollably (have a seizure). °· You vomit blood. Blood in vomit may look bright red, or it may look like coffee grounds. °· You have blood in your stool. Blood in stool may be bright red, or it may make stool appear black and tarry and make it smell bad. °· You become light-headed or you faint. °If you ever feel like you may hurt yourself or others, or have thoughts about taking your own life, get help right away. You can go to your nearest emergency department or call: °· Your local emergency services (911 in the [place] °· A suicide crisis helpline, such as the National Suicide Prevention Lifeline at 1-800-273-8255. This is open 24 hours a day. ° °This information is not intended to replace advice given to you by your health care provider. Make sure you discuss any questions you have with your health care provider. °Document Released: 02/12/2005 Document Revised: 01/19/2016 Document Reviewed: 01/19/2016 °[employer] Interactive Patient Education © 2018 [employer] ° °

## 2017-03-21 NOTE — Discharge Summary (Signed)
[facility]  Physician Discharge Summary   Patient ID: David Burnett MRN: [id number] DOB/AGE: 1991/03/27 26 y.o.  Admit date: 03/20/2017 Discharge date: 03/21/2017  PCP: David Burnett  DISCHARGE DIAGNOSES:  Principal Problem:   Acute encephalopathy Active Problems:   Non-traumatic rhabdomyolysis   Alcoholic intoxication with complication (HCC)   RECOMMENDATIONS FOR OUTPATIENT FOLLOW UP: 1. The patient told to follow-up with neurosurgery as outpatient for his abnormal MRI brain   DISCHARGE CONDITION: fair  Diet recommendation: Regular  Filed Weights   03/20/17 0600  Weight: 67.1 kg (147 lb 14.9 oz)    INITIAL HISTORY: 26 year old African-American male was brought into the EMS from a gas station where he was found to be passed out in the bathroom.  Patient had multiple bottles of alcohol around him.  He was noted to be intoxicated.  He was difficult to arouse and had low blood pressures.  Hence he was hospitalized for further management.   HOSPITAL COURSE:   Acute toxic encephalopathy most likely due to alcohol intoxication Mental status back to baseline.  Hypotension likely due to hypovolemia Patient was noted to have low blood pressure when initially evaluated.  Blood pressure was low probably due to an element of hypovolemia.  Blood pressures have improved.  There is no evidence for any infection so sepsis is unlikely.  Lactic acidosis This is due to alcohol intake.   Mild rhabdomyolysis CK was noted to be mildly elevated.  Improved with IV fluids.  Low TSH Repeat TSH and free T4 were normal.  Right occipitotemporal developmental venous anomaly CT head raised concern for abnormality in the occipital area.  MRI brain was done revealed Large right occipitotemporal developmental venous anomaly and subcentimeter adjacent cavernoma.  This finding was discussed with neurosurgery, Dr. Cyndy Freeze.  No reason for any urgent intervention.  He mentioned that  the patient can follow-up with him in his office in the next few weeks.  Patient was informed of this finding.  Patient does not have any focal neurological deficits.  Acute alcohol intoxication/hypoglycemia Patient was counseled regarding his alcohol intake and his cigarette smoking.  He did not have any alcohol withdrawal symptoms in the hospital.    Overall stable.  Patient does complain of some body aches today.  However his vital signs are stable his saturations are normal.  His abdomen is benign to examination.  No rashes are noted.  No bruising is noted.  He has tolerated his diet.  The patient was reassured.  He is okay for discharge.  He was told to seek attention if his symptoms get worse.   PERTINENT LABS:  The results of significant diagnostics from this hospitalization (including imaging, microbiology, ancillary and laboratory) are listed below for reference.    Microbiology: Recent Results (from the past 240 hour(s))  MRSA PCR Screening     Status: None   Collection Time: 03/20/17  6:07 AM  Result Value Ref Range Status   MRSA by PCR NEGATIVE NEGATIVE Final    Comment:        The GeneXpert MRSA Assay (FDA approved for NASAL specimens only), is one component of a comprehensive MRSA colonization surveillance program. It is not intended to diagnose MRSA infection nor to guide or monitor treatment for MRSA infections.      Labs: Basic Metabolic Panel:  Recent Labs Lab 03/20/17 0058 03/20/17 0633 03/21/17 0403  NA 142 142 136  K 3.8 3.5 3.8  CL 105 110 105  CO2 26 22 25  GLUCOSE 78 58* 89  BUN 20 26* 14  CREATININE 1.07 1.00 1.04  CALCIUM 9.2 8.2* 8.9  MG  --  1.8  --    Liver Function Tests:  Recent Labs Lab 03/20/17 0058 03/20/17 0633  AST 47* 49*  ALT 34 32  ALKPHOS 52 46  BILITOT 0.5 0.4  PROT 7.8 6.8  ALBUMIN 4.5 3.9    Recent Labs Lab 03/20/17 0633  AMMONIA 26   CBC:  Recent Labs Lab 03/20/17 0058 03/20/17 0633 03/21/17 0403    WBC 7.7 7.1 6.3  NEUTROABS 5.6  --   --   HGB 13.9 12.4* 12.9*  HCT 41.5 37.9* 38.2*  MCV 95.8 97.4 95.5  PLT 267 254 244   Cardiac Enzymes:  Recent Labs Lab 03/20/17 0058 03/20/17 0633 03/21/17 0403  CKTOTAL 1,402* 930* 681*  TROPONINI  --  <0.03  --     CBG:  Recent Labs Lab 03/20/17 1732 03/20/17 1926 03/20/17 2334 03/21/17 0323 03/21/17 0821  GLUCAP 77 160* 116* 87 113*     IMAGING STUDIES Dg Wrist Complete Left  Result Date: 03/12/2017 CLINICAL DATA:  Radial wrist pain after fall. EXAM: LEFT WRIST - COMPLETE 3+ VIEW COMPARISON:  None. FINDINGS: There is no evidence of fracture or dislocation. There is no evidence of arthropathy or other focal bone abnormality. Lunotriquetral coalition. Soft tissues are unremarkable. IMPRESSION: Negative. If there is continued clinical concern for occult scaphoid fracture, recommend follow up x-rays in 10-14 days. Electronically Signed   By: Titus Dubin M.D.   On: 03/12/2017 12:57   Ct Head Wo Contrast  Result Date: 03/20/2017 CLINICAL DATA:  Passed out on a bathroom floor IV gas station. Intoxication. EXAM: CT HEAD WITHOUT CONTRAST TECHNIQUE: Contiguous axial images were obtained from the base of the skull through the vertex without intravenous contrast. COMPARISON:  None. FINDINGS: Brain: No evidence of acute infarction, hemorrhage, hydrocephalus, extra-axial collection or mass effect. Faint linear hyperdensity in the right occipital lobe, likely a developmental venous anomaly. Vascular: No hyperdense vessel. Skull: No fracture or focal lesion. Sinuses/Orbits: Paranasal sinuses and mastoid air cells are clear. The visualized orbits are unremarkable. Other: None. IMPRESSION: Faint linear hyperdensity in the right occipital lobe is likely a developmental venous anomaly. This is likely incidental, however MRI/MRA would provide more detailed evaluation. Electronically Signed   By: Jeb Levering M.D.   On: 03/20/2017 03:13   Mr Jodene Nam  Head Wo Contrast  Result Date: 03/20/2017 CLINICAL DATA:  26 y/o  M; encephalopathy.  ETOH. EXAM: MRI HEAD WITHOUT CONTRAST MRA HEAD WITHOUT CONTRAST TECHNIQUE: Multiplanar, multiecho pulse sequences of the brain and surrounding structures were obtained without intravenous contrast. Angiographic images of the head were obtained using MRA technique without contrast. COMPARISON:  03/20/2017 CT of the head. FINDINGS: MRI HEAD FINDINGS Brain: No reduced diffusion to suggest acute or early subacute infarction. No hydrocephalus, extra-axial collection, or effacement of basilar cisterns. Large developmental venous anomaly within the right temporal occipital junction. Small cavernoma within right posterior temporal periventricular white matter adjacent to the developmental venous anomaly measuring 8 mm (series 12, image 13 and series 14, image 13). Vascular: As below. Skull and upper cervical spine: Normal marrow signal. Sinuses/Orbits: Mild diffuse paranasal sinus mucosal thickening. No abnormal signal of mastoid air cells. Orbits are unremarkable. Other: None. MRA HEAD FINDINGS Motion degradation at the circle of Willis and above. Anterior circulation: No large vessel occlusion, aneurysm, or significant stenosis is identified. Posterior circulation: No large vessel occlusion, aneurysm, or  significant stenosis is identified. Vascular anatomy: Anterior communicating artery. No posterior communicating artery identified, likely hypoplastic or absent. IMPRESSION: MRI head: 1. Large right occipitotemporal developmental venous anomaly and subcentimeter adjacent cavernoma. 2. Mild paranasal sinus disease. 3. Otherwise normal MRI of the brain. MRA head: Normal MRA of the head. Electronically Signed   By: Kristine Garbe M.D.   On: 03/20/2017 21:07   Mr Brain Wo Contrast  Result Date: 03/20/2017 CLINICAL DATA:  26 y/o  M; encephalopathy.  ETOH. EXAM: MRI HEAD WITHOUT CONTRAST MRA HEAD WITHOUT CONTRAST TECHNIQUE:  Multiplanar, multiecho pulse sequences of the brain and surrounding structures were obtained without intravenous contrast. Angiographic images of the head were obtained using MRA technique without contrast. COMPARISON:  03/20/2017 CT of the head. FINDINGS: MRI HEAD FINDINGS Brain: No reduced diffusion to suggest acute or early subacute infarction. No hydrocephalus, extra-axial collection, or effacement of basilar cisterns. Large developmental venous anomaly within the right temporal occipital junction. Small cavernoma within right posterior temporal periventricular white matter adjacent to the developmental venous anomaly measuring 8 mm (series 12, image 13 and series 14, image 13). Vascular: As below. Skull and upper cervical spine: Normal marrow signal. Sinuses/Orbits: Mild diffuse paranasal sinus mucosal thickening. No abnormal signal of mastoid air cells. Orbits are unremarkable. Other: None. MRA HEAD FINDINGS Motion degradation at the circle of Willis and above. Anterior circulation: No large vessel occlusion, aneurysm, or significant stenosis is identified. Posterior circulation: No large vessel occlusion, aneurysm, or significant stenosis is identified. Vascular anatomy: Anterior communicating artery. No posterior communicating artery identified, likely hypoplastic or absent. IMPRESSION: MRI head: 1. Large right occipitotemporal developmental venous anomaly and subcentimeter adjacent cavernoma. 2. Mild paranasal sinus disease. 3. Otherwise normal MRI of the brain. MRA head: Normal MRA of the head. Electronically Signed   By: Kristine Garbe M.D.   On: 03/20/2017 21:07   Dg Chest Port 1 View  Result Date: 03/20/2017 CLINICAL DATA:  Hypotension. EXAM: PORTABLE CHEST 1 VIEW COMPARISON:  None. FINDINGS: The heart size and mediastinal contours are within normal limits. Both lungs are clear. No pneumothorax or pleural effusion is noted. The visualized skeletal structures are unremarkable. IMPRESSION:  No acute cardiopulmonary abnormality seen. Electronically Signed   By: Marijo Conception, M.D.   On: 03/20/2017 07:24    DISCHARGE EXAMINATION: Vitals:   03/21/17 0004 03/21/17 0400 03/21/17 0700 03/21/17 0800  BP: 134/72  128/79   Pulse: 76  76   Resp: 18  17   Temp:  98.4 F (36.9 C)  98.1 F (36.7 C)  TempSrc:  Oral  Oral  SpO2: 100%  100%   Weight:      Height:       General appearance: alert, cooperative, appears stated age and no distress Head: Normocephalic, without obvious abnormality, atraumatic Back: symmetric, no curvature. ROM normal. No CVA tenderness. Resp: clear to auscultation bilaterally Cardio: regular rate and rhythm, S1, S2 normal, no murmur, click, rub or gallop GI: soft, non-tender; bowel sounds normal; no masses,  no organomegaly Extremities: extremities normal, atraumatic, no cyanosis or edema Pulses: 2+ and symmetric Neurologic: Alert and oriented X 3, normal strength and tone. Normal symmetric reflexes. Normal coordination and gait  DISPOSITION: Home  Discharge Instructions    Call MD for:  extreme fatigue    Complete by:  As directed    Call MD for:  persistant dizziness or light-headedness    Complete by:  As directed    Call MD for:  persistant nausea and vomiting  Complete by:  As directed    Call MD for:  severe uncontrolled pain    Complete by:  As directed    Call MD for:  temperature >100.4    Complete by:  As directed    Diet general    Complete by:  As directed    Discharge instructions    Complete by:  As directed    Please be sure to follow up with the neurosurgeon as discussed. Please stop drinking alcohol and stop smoking cigarettes. Stay well hydrated.  You were cared for by a hospitalist during your hospital stay. If you have any questions about your discharge medications or the care you received while you were in the hospital after you are discharged, you can call the unit and asked to speak with the hospitalist on call if the  hospitalist that took care of you is not available. Once you are discharged, your primary care physician will handle any further medical issues. Please note that NO REFILLS for any discharge medications will be authorized once you are discharged, as it is imperative that you return to your primary care physician (or establish a relationship with a primary care physician if you do not have one) for your aftercare needs so that they can reassess your need for medications and monitor your lab values. If you do not have a primary care physician, you can call [phone] for a physician referral.   Increase activity slowly    Complete by:  As directed       ALLERGIES: No Known Allergies   Discharge Medication List as of 03/21/2017 10:02 AM    START taking these medications   Details  Multiple Vitamin (MULTIVITAMIN WITH MINERALS) TABS tablet Take 1 tablet by mouth daily., Starting Sat 03/21/2017, Print    ranitidine (ZANTAC) 150 MG capsule Take 1 capsule (150 mg total) by mouth 2 (two) times daily., Starting Sat 03/21/2017, Print      STOP taking these medications     aspirin 325 MG tablet      ibuprofen (ADVIL,MOTRIN) 800 MG tablet          Follow-up Information    Ditty, Kevan Ny, MD. Schedule an appointment as soon as possible for a visit in 4 week(s).   Specialty:  Neurosurgery Why:  to discuss abnormal brain mri findings Contact information: [place] [address] [phone]           TOTAL DISCHARGE TIME: 35 minutes  [facility] Pager [phone]  03/21/2017, 2:37 PM

## 2017-03-21 NOTE — Progress Notes (Signed)
Pt took all belongings with him and and social work provided pt with 4 bus passes.  Irven Baltimore, RN

## 2017-03-22 LAB — GLUCOSE, CAPILLARY: Glucose-Capillary: 56 mg/dL — ABNORMAL LOW (ref 65–99)

## 2020-04-11 ENCOUNTER — Other Ambulatory Visit: Payer: Self-pay

## 2020-04-11 ENCOUNTER — Emergency Department (HOSPITAL_COMMUNITY)
Admission: EM | Admit: 2020-04-11 | Discharge: 2020-04-11 | Disposition: A | Discharge status: Home / Self Care | Payer: Self-pay | Attending: Emergency Medicine | Admitting: Emergency Medicine

## 2020-04-11 DIAGNOSIS — Z5900 Homelessness unspecified: Secondary | ICD-10-CM | POA: Insufficient documentation

## 2020-04-11 DIAGNOSIS — F17210 Nicotine dependence, cigarettes, uncomplicated: Secondary | ICD-10-CM | POA: Insufficient documentation

## 2020-04-11 LAB — CBC WITH DIFFERENTIAL/PLATELET
Abs Immature Granulocytes: 0.02 10*3/uL (ref 0.00–0.07)
Basophils Absolute: 0 10*3/uL (ref 0.0–0.1)
Basophils Relative: 1 %
Eosinophils Absolute: 0.2 10*3/uL (ref 0.0–0.5)
Eosinophils Relative: 4 %
HCT: 45.5 % (ref 39.0–52.0)
Hemoglobin: 14.9 g/dL (ref 13.0–17.0)
Immature Granulocytes: 0 %
Lymphocytes Relative: 34 %
Lymphs Abs: 1.6 10*3/uL (ref 0.7–4.0)
MCH: 32.3 pg (ref 26.0–34.0)
MCHC: 32.7 g/dL (ref 30.0–36.0)
MCV: 98.7 fL (ref 80.0–100.0)
Monocytes Absolute: 0.4 10*3/uL (ref 0.1–1.0)
Monocytes Relative: 8 %
Neutro Abs: 2.4 10*3/uL (ref 1.7–7.7)
Neutrophils Relative %: 53 %
Platelets: 314 10*3/uL (ref 150–400)
RBC: 4.61 MIL/uL (ref 4.22–5.81)
RDW: 13.3 % (ref 11.5–15.5)
WBC: 4.6 10*3/uL (ref 4.0–10.5)
nRBC: 0 % (ref 0.0–0.2)

## 2020-04-11 LAB — BASIC METABOLIC PANEL
Anion gap: 15 (ref 5–15)
BUN: 14 mg/dL (ref 6–20)
CO2: 21 mmol/L — ABNORMAL LOW (ref 22–32)
Calcium: 9.4 mg/dL (ref 8.9–10.3)
Chloride: 103 mmol/L (ref 98–111)
Creatinine, Ser: 0.81 mg/dL (ref 0.61–1.24)
GFR, Estimated: >60 mL/min (ref >60)
Glucose, Bld: 87 mg/dL (ref 70–99)
Potassium: 4.1 mmol/L (ref 3.5–5.1)
Sodium: 139 mmol/L (ref 135–145)

## 2020-04-11 NOTE — ED Notes (Signed)
Pt not responding to tapping nor calling his name so that I can ambulate him. Pt is breathing nurse notified.

## 2020-04-11 NOTE — Progress Notes (Signed)
CSW spoke patient at bedside - he is homeless and his clothes were cut off last night. Patient states he passed out last night at a gas station on [address] after consuming alcohol. Patient denies any drug use but he was reviewing a police citation upon CSW's arrival. Patient reports the citation is for simple possession of marijuana. CSW encouraged patient to go to court to avoid further penalty. Patient denies any further needs or concerns at this time - aware he is being discharged.  CSW provided patient with a sweater and a bus pass for transportation.  Madilyn Fireman, MSW, LCSW-A Transitions of Care  Clinical Social Worker  [facility] Emergency Departments  Medical ICU [phone]

## 2020-04-11 NOTE — Discharge Instructions (Addendum)
Please follow up with your primary care provider within 5-7 days for re-evaluation of your symptoms. If you do not have a primary care provider, information for a healthcare clinic has been provided for you to make arrangements for follow up care. Please return to the emergency department for any new or worsening symptoms. ° °

## 2020-04-11 NOTE — ED Provider Notes (Signed)
[facility] EMERGENCY DEPARTMENT Provider Note   CSN: [id number] Arrival date & time:        History Chief Complaint  Patient presents with  . Homeless    David Burnett is a 29 y.o. male.  HPI   29 y/o male with a h/o rhabdomyolysis, etoh intoxication who presents to the Ed today for eval of homelessness.  Patient was brought in by EMS after being found in a public facility.  Per EMS they were called out for loss of consciousness.  On my eval the patient is not participating in the exam but is able to verbalize that he went into a building because it was cold.  He says he lost consciousness but does not think he hit his head.  He is not hurting anywhere.  He does not have any chest pain or shortness of breath.  He admits to drinking alcohol yesterday morning.  No past medical history on file.  Patient Active Problem List   Diagnosis Date Noted  . Acute encephalopathy 03/20/2017  . Non-traumatic rhabdomyolysis 03/20/2017  . Alcoholic intoxication with complication ([place]) [id number]/9629    No past surgical history on file.     Family History  Family history unknown: Yes    Social History   Tobacco Use  . Smoking status: Current Every Day Smoker    Types: Cigarettes  . Smokeless tobacco: Never Used  Vaping Use  . Vaping Use: Unknown  Substance Use Topics  . Alcohol use: Yes  . Drug use: Yes    Types: Marijuana    Home Medications Prior to Admission medications   Medication Sig Start Date End Date Taking? Authorizing Provider  Multiple Vitamin (MULTIVITAMIN WITH MINERALS) TABS tablet Take 1 tablet by mouth daily. 03/21/17   Bonnielee Haff, MD  ranitidine (ZANTAC) 150 MG capsule Take 1 capsule (150 mg total) by mouth 2 (two) times daily. 03/21/17   Bonnielee Haff, MD    Allergies    Patient has no known allergies.  Review of Systems   Review of Systems  Constitutional: Negative for fever.  HENT: Negative for ear pain and sore throat.   Eyes:  Negative for visual disturbance.  Respiratory: Negative for cough and shortness of breath.   Cardiovascular: Negative for chest pain.  Gastrointestinal: Negative for abdominal pain, constipation, diarrhea, nausea and vomiting.  Genitourinary: Negative for dysuria and hematuria.  Musculoskeletal: Negative for back pain and neck pain.  Skin: Negative for rash.  Neurological: Positive for syncope. Negative for headaches.       No head trauma  All other systems reviewed and are negative.   Physical Exam Updated Vital Signs BP 113/77   Temp 97.8 F (36.6 C) (Oral)   Resp 18   Ht 5\' 5"  (1.651 m)   Wt 66.7 kg   SpO2 98%   BMI 24.47 kg/m   Physical Exam Vitals and nursing note reviewed.  Constitutional:      Appearance: He is well-developed.  HENT:     Head: Normocephalic and atraumatic.  Eyes:     Conjunctiva/sclera: Conjunctivae normal.  Cardiovascular:     Rate and Rhythm: Normal rate and regular rhythm.     Heart sounds: Normal heart sounds. No murmur heard.   Pulmonary:     Effort: Pulmonary effort is normal. No respiratory distress.     Breath sounds: Normal breath sounds. No wheezing, rhonchi or rales.  Chest:     Chest wall: No tenderness.  Abdominal:  General: Bowel sounds are normal.     Palpations: Abdomen is soft.     Tenderness: There is no abdominal tenderness. There is no guarding or rebound.  Musculoskeletal:     Cervical back: Neck supple.     Comments: No TTP to the CTL spine.   Skin:    General: Skin is warm and dry.  Neurological:     Mental Status: He is alert.     Comments: Mental Status:  Eyes closed throughout eval, pt does not appear somnolent but rather is voluntarily not participating in exam unless noxious stimuli is applied, able to give a coherent history. Speech fluent without evidence of aphasia. Able to follow 2 step commands without difficulty.  Cranial Nerves:  II:  pupils equal, round, reactive to light III,IV, VI: ptosis not  present, extra-ocular motions intact bilaterally  V,VII: smile symmetric, facial light touch sensation equal VIII: hearing grossly normal to voice  X: uvula elevates symmetrically  XI: bilateral shoulder shrug symmetric and strong XII: midline tongue extension without fassiculations Motor:  Normal tone. 5/5 strength of BUE and BLE major muscle groups including strong and equal grip strength and dorsiflexion/plantar flexion Sensory: light touch normal in all extremities. Gait: normal gait and balance.       ED Results / Procedures / Treatments   Labs (all labs ordered are listed, but only abnormal results are displayed) Labs Reviewed  BASIC METABOLIC PANEL - Abnormal; Notable for the following components:      Result Value   CO2 21 (*)    All other components within normal limits  CBC WITH DIFFERENTIAL/PLATELET    EKG EKG Interpretation  Date/Time:  Wednesday April 11 2020 06:31:43 EST Ventricular Rate:  77 PR Interval:    QRS Duration: 83 QT Interval:  369 QTC Calculation: 418 R Axis:   73 Text Interpretation: Sinus rhythm ST elev, probable normal early repol pattern No acute changes Confirmed by Addison Lank [phone]) on 04/11/2020 6:43:42 AM   Radiology No results found.  Procedures Procedures (including critical care time)  Medications Ordered in ED Medications - No data to display  ED Course  I have reviewed the triage vital signs and the nursing notes.  Pertinent labs & imaging results that were available during my care of the patient were reviewed by me and considered in my medical decision making (see chart for details).    MDM Rules/Calculators/A&P                          29 year old male with history of homelessness presenting for evaluation after he was found in a public building.  Apparently EMS was called out for a loss of consciousness but is unclear if this actually occurred.  Patient has no signs of trauma on my evaluation.  He is oriented  without any focal neuro deficits.  Does not have any midline tenderness.  No tenderness to the chest or abdomen.  Does not appear dehydrated.  EKG without any ischemia or arrhythmia.  Shows evidence of early repolarization.  CBC and BMP unremarkable. on reassessment the patient is now awake and able to ambulate.  He is in no acute distress.  His work-up has been reassuring.  Do not feel that he has demonstrated evidence of any emergent medical complaint at this time that would require further work-up or admission in the hospital.  He appears clinically sober and appropriate for discharge home. Advised pcp f/u and return precautions. He voices  understanding.   Final Clinical Impression(s) / ED Diagnoses Final diagnoses:  Homelessness    Rx / DC Orders ED Discharge Orders    None       Rodney Booze, PA-C 04/11/20 [id number]    Varney Biles, MD 04/13/20 [phone]

## 2020-04-11 NOTE — ED Notes (Signed)
Pt able to ambulate. A little unsteady and swaying back and fourth.

## 2020-04-11 NOTE — ED Notes (Signed)
Pt discharge instructions and bus pass given to pt. Pt refused discharge vitals. Pt discharged.

## 2020-04-11 NOTE — ED Notes (Addendum)
Pt bib [facility] for aloc. Per EMS patient was found in public facility. Patient is known to be homeless. Per ems patient will respond when he wants to and will talk freely when he wants. Patient vss and airway intact.

## 2020-04-11 NOTE — ED Notes (Signed)
Officer left citation on bedside table.

## 2021-06-07 ENCOUNTER — Emergency Department (HOSPITAL_COMMUNITY)
Admission: EM | Admit: 2021-06-07 | Discharge: 2021-06-07 | Disposition: A | Discharge status: Home / Self Care | Payer: 59 | Attending: Emergency Medicine | Admitting: Emergency Medicine

## 2021-06-07 ENCOUNTER — Encounter (HOSPITAL_COMMUNITY): Payer: Self-pay | Admitting: Emergency Medicine

## 2021-06-07 ENCOUNTER — Other Ambulatory Visit: Payer: Self-pay

## 2021-06-07 DIAGNOSIS — Y908 Blood alcohol level of 240 mg/100 ml or more: Secondary | ICD-10-CM | POA: Diagnosis not present

## 2021-06-07 DIAGNOSIS — F10129 Alcohol abuse with intoxication, unspecified: Secondary | ICD-10-CM | POA: Insufficient documentation

## 2021-06-07 DIAGNOSIS — F1020 Alcohol dependence, uncomplicated: Secondary | ICD-10-CM

## 2021-06-07 DIAGNOSIS — R4182 Altered mental status, unspecified: Secondary | ICD-10-CM | POA: Insufficient documentation

## 2021-06-07 DIAGNOSIS — F10920 Alcohol use, unspecified with intoxication, uncomplicated: Secondary | ICD-10-CM

## 2021-06-07 LAB — COMPREHENSIVE METABOLIC PANEL
ALT: 19 U/L (ref 0–44)
AST: 23 U/L (ref 15–41)
Albumin: 4 g/dL (ref 3.5–5.0)
Alkaline Phosphatase: 67 U/L (ref 38–126)
Anion gap: 11 (ref 5–15)
BUN: 16 mg/dL (ref 6–20)
CO2: 24 mmol/L (ref 22–32)
Calcium: 9.2 mg/dL (ref 8.9–10.3)
Chloride: 104 mmol/L (ref 98–111)
Creatinine, Ser: 0.82 mg/dL (ref 0.61–1.24)
GFR, Estimated: >60 mL/min (ref >60)
Glucose, Bld: 116 mg/dL — ABNORMAL HIGH (ref 70–99)
Potassium: 3.5 mmol/L (ref 3.5–5.1)
Sodium: 139 mmol/L (ref 135–145)
Total Bilirubin: 0.4 mg/dL (ref 0.3–1.2)
Total Protein: 7.3 g/dL (ref 6.5–8.1)

## 2021-06-07 LAB — CBG MONITORING, ED: Glucose-Capillary: 117 mg/dL — ABNORMAL HIGH (ref 70–99)

## 2021-06-07 LAB — CBC
HCT: 39.9 % (ref 39.0–52.0)
Hemoglobin: 13.2 g/dL (ref 13.0–17.0)
MCH: 32.3 pg (ref 26.0–34.0)
MCHC: 33.1 g/dL (ref 30.0–36.0)
MCV: 97.6 fL (ref 80.0–100.0)
Platelets: 326 10*3/uL (ref 150–400)
RBC: 4.09 MIL/uL — ABNORMAL LOW (ref 4.22–5.81)
RDW: 14.7 % (ref 11.5–15.5)
WBC: 6.4 10*3/uL (ref 4.0–10.5)
nRBC: 0 % (ref 0.0–0.2)

## 2021-06-07 LAB — RAPID URINE DRUG SCREEN, HOSP PERFORMED
Amphetamines: NOT DETECTED
Barbiturates: NOT DETECTED
Benzodiazepines: NOT DETECTED
Cocaine: NOT DETECTED
Opiates: NOT DETECTED
Tetrahydrocannabinol: NOT DETECTED

## 2021-06-07 LAB — ETHANOL: Alcohol, Ethyl (B): 282 mg/dL — ABNORMAL HIGH (ref <10)

## 2021-06-07 NOTE — ED Provider Notes (Signed)
Patient now awake and alert and request discharge.he is now clinically sober   Lacretia Leigh, MD 06/07/21 [phone]

## 2021-06-07 NOTE — ED Triage Notes (Signed)
BIB EMS from [facility], pt had laid down to sleep next to the road in some mulch and bushes. Per EMS he is intoxicated, was carrying a bottle of liquor. Alert to painful stimuli.   Vitals w/ EMS  BP 122/66 HR 80 O2 98%  CBG 127

## 2021-06-07 NOTE — ED Provider Notes (Addendum)
[facility] DEPT Provider Note   CSN: [id number] Arrival date & time: 06/07/21  1333     History  Chief Complaint  Patient presents with   Alcohol Intoxication   Altered Mental Status    David Burnett is a 31 y.o. male.  Patient with hx etoh abuse, brought to ED via EMS after was noted to be walking unsteadily aside of roadway, and then was noted by bystander to lie down in an area of mulch/bushes.  EMS notes pt was verbally responsive to stimuli (yelling 'get the f@#$ off of me'). CBG was normal. Pt uncooperative w history - unclear from history if, when, and quantity of possible etoh use today - level 5 caveat.  The history is provided by the patient, medical records and the EMS personnel. The history is limited by the condition of the patient.      Home Medications Prior to Admission medications   Medication Sig Start Date End Date Taking? Authorizing Provider  Multiple Vitamin (MULTIVITAMIN WITH MINERALS) TABS tablet Take 1 tablet by mouth daily. 03/21/17   Bonnielee Haff, MD  ranitidine (ZANTAC) 150 MG capsule Take 1 capsule (150 mg total) by mouth 2 (two) times daily. 03/21/17   Bonnielee Haff, MD      Allergies    Patient has no known allergies.    Review of Systems   Review of Systems  Unable to perform ROS: Patient unresponsive  Level 5 caveat - pt not responsive/participative w ros.    Physical Exam Updated Vital Signs BP 117/73 (BP Location: Right Arm)    Pulse 76    Resp 18    Ht 1.651 m (5\' 5" )    Wt 68 kg    SpO2 100%    BMI 24.96 kg/m  Physical Exam Vitals and nursing note reviewed.  Constitutional:      Appearance: Normal appearance. He is well-developed.  HENT:     Head: Atraumatic.     Nose: Nose normal.     Mouth/Throat:     Mouth: Mucous membranes are moist.     Pharynx: Oropharynx is clear.  Eyes:     General: No scleral icterus.    Conjunctiva/sclera: Conjunctivae normal.     Pupils: Pupils are equal, round,  and reactive to light.  Neck:     Trachea: No tracheal deviation.  Cardiovascular:     Rate and Rhythm: Normal rate and regular rhythm.     Pulses: Normal pulses.     Heart sounds: Normal heart sounds. No murmur heard.   No friction rub. No gallop.  Pulmonary:     Effort: Pulmonary effort is normal. No accessory muscle usage or respiratory distress.     Breath sounds: Normal breath sounds.  Abdominal:     General: Bowel sounds are normal. There is no distension.     Palpations: Abdomen is soft.     Tenderness: There is no abdominal tenderness. There is no guarding.  Genitourinary:    Comments: No cva tenderness. Musculoskeletal:        General: No swelling.     Cervical back: Normal range of motion and neck supple. No rigidity.     Comments: CTLS spine, non tender, aligned, no step off. No focal extremity pain, sts, or tenderness w palpation.   Skin:    General: Skin is warm and dry.     Findings: No rash.  Neurological:     Mental Status: He is alert.     Comments: Pts  opens eyes spontaneously. Moves extremities purposefully but not following commands. Responds verbally to stimuli.   Psychiatric:     Comments: Altered, ?intoxicated.     ED Results / Procedures / Treatments   Labs (all labs ordered are listed, but only abnormal results are displayed) Results for orders placed or performed during the hospital encounter of 06/07/21  POC CBG, ED  Result Value Ref Range   Glucose-Capillary 117 (H) 70 - 99 mg/dL    EKG None  Radiology No results found.  Procedures Procedures    Medications Ordered in ED Medications - No data to display  ED Course/ Medical Decision Making/ A&P                           Medical Decision Making Amount and/or Complexity of Data Reviewed Labs: ordered.   CBG. Labs sent.   Reviewed nursing notes and prior charts for additional history. External results reviewed. Additional hx by EMS.    Labs reviewed/interpreted by me - glucose  normal. Etoh pending.   Recheck pt, 1530, no change from prior - resting comfortably, o2 sats 100%, arousable. No apparent pain/discomfort.   2332 signed out to Dr Zenia Resides to check pending labs, recheck pt, and dispo appropriately.             Final Clinical Impression(s) / ED Diagnoses Final diagnoses:  None    Rx / DC Orders ED Discharge Orders     None          Lajean Saver, MD 06/07/21 1534

## 2021-06-07 NOTE — ED Notes (Signed)
Attempted to collect blood work pt started swinging and told this to move and called this tech a name attempted 2x

## 2021-06-07 NOTE — Discharge Instructions (Addendum)
It was our pleasure to provide your ER care today - we hope that you feel better.  Avoid alcohol use - follow up with AA, and use resource guide provided for additional treatment programs and community resources.   Follow up with primary care doctor in the coming week.  Return to ER if worse, new symptoms, fevers, new/severe pain, chest pain, trouble breathing, or other concern.

## 2021-06-07 NOTE — ED Notes (Signed)
Became combative while RN was attempting to get an oral temperature.

## 2021-06-11 ENCOUNTER — Emergency Department (HOSPITAL_COMMUNITY)
Admission: EM | Admit: 2021-06-11 | Discharge: 2021-06-12 | Disposition: A | Discharge status: Home / Self Care | Payer: 59 | Attending: Emergency Medicine | Admitting: Emergency Medicine

## 2021-06-11 ENCOUNTER — Other Ambulatory Visit: Payer: Self-pay

## 2021-06-11 ENCOUNTER — Encounter (HOSPITAL_COMMUNITY): Payer: Self-pay | Admitting: Emergency Medicine

## 2021-06-11 DIAGNOSIS — Z5321 Procedure and treatment not carried out due to patient leaving prior to being seen by health care provider: Secondary | ICD-10-CM | POA: Insufficient documentation

## 2021-06-11 DIAGNOSIS — Y907 Blood alcohol level of 200-239 mg/100 ml: Secondary | ICD-10-CM | POA: Insufficient documentation

## 2021-06-11 DIAGNOSIS — R519 Headache, unspecified: Secondary | ICD-10-CM | POA: Diagnosis present

## 2021-06-11 DIAGNOSIS — R400 Somnolence: Secondary | ICD-10-CM | POA: Diagnosis not present

## 2021-06-11 DIAGNOSIS — F10120 Alcohol abuse with intoxication, uncomplicated: Secondary | ICD-10-CM | POA: Diagnosis not present

## 2021-06-11 NOTE — ED Triage Notes (Signed)
Patient presents intoxicated with ETOH reports headache today , poor historian at initial encounter [place] .

## 2021-06-11 NOTE — ED Provider Triage Note (Signed)
Emergency Medicine Provider Triage Evaluation Note  Christy Friede , a 31 y.o. male  was evaluated in triage.  Patient presents to the emergency department today quite somnolent.  Informs me that he drank 16--but refuses to specify what he drank--denies any recreational drug use.  Falls asleep frequently during exam denies any pain.  Review of Systems  Positive: Alcohol use Negative: Fever, pain  Physical Exam  BP 128/64 (BP Location: Right Arm)    Pulse 91    Temp (!) 97.3 F (36.3 C) (Oral)    Resp 20    SpO2 98%  Gen:   Awake, no distress, somnolent falls back asleep easily but awakens to sternal rub Resp:  Normal effort. MSK:   Moves extremities without difficulty. Other:  .  Medical Decision Making  Medically screening exam initiated at 11:54 PM.  Appropriate orders placed.  Erroll Wilbourne was informed that the remainder of the evaluation will be completed by another provider, this initial triage assessment does not replace that evaluation, and the importance of remaining in the ED until their evaluation is complete.  [address]   Pati Gallo [place], [place] 06/11/21 2357

## 2021-06-12 LAB — ETHANOL: Alcohol, Ethyl (B): 206 mg/dL — ABNORMAL HIGH (ref <10)

## 2021-06-12 LAB — CBC WITH DIFFERENTIAL/PLATELET
Abs Immature Granulocytes: 0.02 10*3/uL (ref 0.00–0.07)
Basophils Absolute: 0 10*3/uL (ref 0.0–0.1)
Basophils Relative: 1 %
Eosinophils Absolute: 0.1 10*3/uL (ref 0.0–0.5)
Eosinophils Relative: 2 %
HCT: 41.7 % (ref 39.0–52.0)
Hemoglobin: 13.8 g/dL (ref 13.0–17.0)
Immature Granulocytes: 0 %
Lymphocytes Relative: 34 %
Lymphs Abs: 1.9 10*3/uL (ref 0.7–4.0)
MCH: 32 pg (ref 26.0–34.0)
MCHC: 33.1 g/dL (ref 30.0–36.0)
MCV: 96.8 fL (ref 80.0–100.0)
Monocytes Absolute: 0.3 10*3/uL (ref 0.1–1.0)
Monocytes Relative: 6 %
Neutro Abs: 3.2 10*3/uL (ref 1.7–7.7)
Neutrophils Relative %: 57 %
Platelets: 306 10*3/uL (ref 150–400)
RBC: 4.31 MIL/uL (ref 4.22–5.81)
RDW: 14.5 % (ref 11.5–15.5)
WBC: 5.6 10*3/uL (ref 4.0–10.5)
nRBC: 0 % (ref 0.0–0.2)

## 2021-06-12 LAB — BASIC METABOLIC PANEL
Anion gap: 11 (ref 5–15)
BUN: 12 mg/dL (ref 6–20)
CO2: 23 mmol/L (ref 22–32)
Calcium: 9.5 mg/dL (ref 8.9–10.3)
Chloride: 106 mmol/L (ref 98–111)
Creatinine, Ser: 0.95 mg/dL (ref 0.61–1.24)
GFR, Estimated: >60 mL/min (ref >60)
Glucose, Bld: 102 mg/dL — ABNORMAL HIGH (ref 70–99)
Potassium: 3.3 mmol/L — ABNORMAL LOW (ref 3.5–5.1)
Sodium: 140 mmol/L (ref 135–145)

## 2021-06-12 NOTE — ED Notes (Signed)
Patient left on own accord °

## 2021-06-15 ENCOUNTER — Encounter (HOSPITAL_COMMUNITY): Payer: Self-pay

## 2021-06-15 ENCOUNTER — Emergency Department (HOSPITAL_COMMUNITY)
Admission: EM | Admit: 2021-06-15 | Discharge: 2021-06-16 | Disposition: A | Discharge status: Home / Self Care | Payer: 59 | Attending: Emergency Medicine | Admitting: Emergency Medicine

## 2021-06-15 ENCOUNTER — Other Ambulatory Visit: Payer: Self-pay

## 2021-06-15 DIAGNOSIS — D649 Anemia, unspecified: Secondary | ICD-10-CM | POA: Insufficient documentation

## 2021-06-15 DIAGNOSIS — Y908 Blood alcohol level of 240 mg/100 ml or more: Secondary | ICD-10-CM | POA: Diagnosis not present

## 2021-06-15 DIAGNOSIS — Z79899 Other long term (current) drug therapy: Secondary | ICD-10-CM | POA: Diagnosis not present

## 2021-06-15 DIAGNOSIS — F10129 Alcohol abuse with intoxication, unspecified: Secondary | ICD-10-CM | POA: Diagnosis present

## 2021-06-15 DIAGNOSIS — F10920 Alcohol use, unspecified with intoxication, uncomplicated: Secondary | ICD-10-CM

## 2021-06-15 NOTE — ED Provider Notes (Signed)
[facility] DEPT Provider Note   CSN: [id number] Arrival date & time: 06/15/21  2240     History  Chief Complaint  Patient presents with   Alcohol Intoxication    David Burnett is a 31 y.o. male with a hx of tobacco use who presents to the ED via EMS for suspected EtOH intoxication. Per EMS received call from PD as patient was found sleeping at the [facility] and they were having trouble waking him up. On EMS arrival patient was sitting up eating tacos, admitted to EtOH. Patient sleeping, not answering questions, level 5 caveat applies secondary to AMS.   HPI     Home Medications Prior to Admission medications   Medication Sig Start Date End Date Taking? Authorizing Provider  Multiple Vitamin (MULTIVITAMIN WITH MINERALS) TABS tablet Take 1 tablet by mouth daily. 03/21/17   Bonnielee Haff, MD  ranitidine (ZANTAC) 150 MG capsule Take 1 capsule (150 mg total) by mouth 2 (two) times daily. 03/21/17   Bonnielee Haff, MD      Allergies    Patient has no known allergies.    Review of Systems   Review of Systems  Unable to perform ROS: Mental status change   Physical Exam Updated Vital Signs BP 120/65    Pulse 85    Resp 14    Ht 5\' 5"  (1.651 m)    Wt 68 kg    SpO2 97%    BMI 24.96 kg/m  Physical Exam Vitals and nursing note reviewed.  Constitutional:      General: He is sleeping. He is not in acute distress.    Appearance: He is well-developed. He is not toxic-appearing.  HENT:     Head: Normocephalic and atraumatic.  Eyes:     General:        Right eye: No discharge.        Left eye: No discharge.     Conjunctiva/sclera: Conjunctivae normal.     Pupils: Pupils are equal, round, and reactive to light.  Cardiovascular:     Rate and Rhythm: Normal rate and regular rhythm.  Pulmonary:     Effort: Pulmonary effort is normal. No respiratory distress.     Breath sounds: Normal breath sounds. No wheezing, rhonchi or rales.  Abdominal:      General: There is no distension.     Palpations: Abdomen is soft.     Tenderness: There is no abdominal tenderness.  Musculoskeletal:     Cervical back: Neck supple.  Skin:    General: Skin is warm and dry.     Findings: No rash.  Neurological:     Comments: Arousible to painful stimuli. Not following commands.     ED Results / Procedures / Treatments   Labs (all labs ordered are listed, but only abnormal results are displayed) Labs Reviewed  CBC  COMPREHENSIVE METABOLIC PANEL  ETHANOL    EKG None  Radiology No results found.  Procedures Procedures    Medications Ordered in ED Medications - No data to display  ED Course/ Medical Decision Making/ A&P                           Medical Decision Making Amount and/or Complexity of Data Reviewed Labs: ordered.   Patient presents to the emergency department via EMS for alcohol intoxication.  He is nontoxic, resting comfortably, his vitals are within normal limits.  He is sleeping but he is arousable and  moving all of his extremities when awake.  He is not consistently following commands.  No visible signs of head trauma, no reports of head trauma prior to arrival.  Will obtain labs and observe closely.  Discussed with EMS for additional history.  Chart reviewed for additional history, seen for similar in the past.  I ordered, reviewed, and interpreted labs including:  CBC: Mild anemia CMP: Unremarkable Ethanol level: Elevated consistent with intoxication.  Patient resting comfortably for several hours in the emergency department, given opportunity to metabolize, on reassessment he is awake, tolerating p.o., ambulatory with steady gait and has clear speech.  No focal neurologic deficits. Appears appropriate for discharge. I discussed results, treatment plan, need for follow-up, and return precautions with the patient. Provided opportunity for questions, patient confirmed understanding and is in agreement with plan.          Final Clinical Impression(s) / ED Diagnoses Final diagnoses:  Alcoholic intoxication without complication [facility])    Rx / DC Orders ED Discharge Orders     None         Amaryllis Dyke, PA-C 06/16/21 0644    Quintella Reichert, MD 06/17/21 0100

## 2021-06-15 NOTE — ED Triage Notes (Addendum)
Pt. BIB [facility] found outside of grocery store intoxicated.  BP: 124/86 HR: 94 O2 92% RA CBG:145

## 2021-06-16 LAB — CBC
HCT: 39.3 % (ref 39.0–52.0)
Hemoglobin: 12.9 g/dL — ABNORMAL LOW (ref 13.0–17.0)
MCH: 31.8 pg (ref 26.0–34.0)
MCHC: 32.8 g/dL (ref 30.0–36.0)
MCV: 96.8 fL (ref 80.0–100.0)
Platelets: 281 10*3/uL (ref 150–400)
RBC: 4.06 MIL/uL — ABNORMAL LOW (ref 4.22–5.81)
RDW: 14.9 % (ref 11.5–15.5)
WBC: 5 10*3/uL (ref 4.0–10.5)
nRBC: 0 % (ref 0.0–0.2)

## 2021-06-16 LAB — COMPREHENSIVE METABOLIC PANEL
ALT: 18 U/L (ref 0–44)
AST: 26 U/L (ref 15–41)
Albumin: 4 g/dL (ref 3.5–5.0)
Alkaline Phosphatase: 61 U/L (ref 38–126)
Anion gap: 7 (ref 5–15)
BUN: 17 mg/dL (ref 6–20)
CO2: 28 mmol/L (ref 22–32)
Calcium: 8.9 mg/dL (ref 8.9–10.3)
Chloride: 107 mmol/L (ref 98–111)
Creatinine, Ser: 0.82 mg/dL (ref 0.61–1.24)
GFR, Estimated: >60 mL/min (ref >60)
Glucose, Bld: 87 mg/dL (ref 70–99)
Potassium: 3.6 mmol/L (ref 3.5–5.1)
Sodium: 142 mmol/L (ref 135–145)
Total Bilirubin: 0.6 mg/dL (ref 0.3–1.2)
Total Protein: 7.2 g/dL (ref 6.5–8.1)

## 2021-06-16 LAB — ETHANOL: Alcohol, Ethyl (B): 289 mg/dL — ABNORMAL HIGH (ref <10)

## 2021-06-16 NOTE — ED Notes (Signed)
Patient ambulatory to restroom with steady gait and no assistance.  °

## 2021-06-16 NOTE — Discharge Instructions (Addendum)
Your labs showed mild anemia - have this rechecked by primary care within 2 weeks. Your alcohol level was elevated in the ER - please avoid excess drinking.

## 2021-06-16 NOTE — ED Notes (Signed)
Pt given a sandwich, crackers, and water.

## 2021-06-25 ENCOUNTER — Emergency Department (HOSPITAL_COMMUNITY)
Admission: EM | Admit: 2021-06-25 | Discharge: 2021-06-25 | Disposition: A | Discharge status: Home / Self Care | Payer: 59 | Attending: Emergency Medicine | Admitting: Emergency Medicine

## 2021-06-25 ENCOUNTER — Encounter (HOSPITAL_COMMUNITY): Payer: Self-pay | Admitting: Emergency Medicine

## 2021-06-25 DIAGNOSIS — F10129 Alcohol abuse with intoxication, unspecified: Secondary | ICD-10-CM | POA: Diagnosis not present

## 2021-06-25 DIAGNOSIS — F10920 Alcohol use, unspecified with intoxication, uncomplicated: Secondary | ICD-10-CM

## 2021-06-25 DIAGNOSIS — Y908 Blood alcohol level of 240 mg/100 ml or more: Secondary | ICD-10-CM | POA: Diagnosis not present

## 2021-06-25 DIAGNOSIS — R7309 Other abnormal glucose: Secondary | ICD-10-CM | POA: Insufficient documentation

## 2021-06-25 LAB — COMPREHENSIVE METABOLIC PANEL
ALT: 19 U/L (ref 0–44)
AST: 24 U/L (ref 15–41)
Albumin: 3.8 g/dL (ref 3.5–5.0)
Alkaline Phosphatase: 54 U/L (ref 38–126)
Anion gap: 7 (ref 5–15)
BUN: 18 mg/dL (ref 6–20)
CO2: 26 mmol/L (ref 22–32)
Calcium: 8.3 mg/dL — ABNORMAL LOW (ref 8.9–10.3)
Chloride: 109 mmol/L (ref 98–111)
Creatinine, Ser: 0.75 mg/dL (ref 0.61–1.24)
GFR, Estimated: >60 mL/min (ref >60)
Glucose, Bld: 90 mg/dL (ref 70–99)
Potassium: 3.8 mmol/L (ref 3.5–5.1)
Sodium: 142 mmol/L (ref 135–145)
Total Bilirubin: 0.1 mg/dL — ABNORMAL LOW (ref 0.3–1.2)
Total Protein: 6.9 g/dL (ref 6.5–8.1)

## 2021-06-25 LAB — CBC WITH DIFFERENTIAL/PLATELET
Abs Immature Granulocytes: 0.03 10*3/uL (ref 0.00–0.07)
Basophils Absolute: 0 10*3/uL (ref 0.0–0.1)
Basophils Relative: 1 %
Eosinophils Absolute: 0.2 10*3/uL (ref 0.0–0.5)
Eosinophils Relative: 3 %
HCT: 37.3 % — ABNORMAL LOW (ref 39.0–52.0)
Hemoglobin: 12.4 g/dL — ABNORMAL LOW (ref 13.0–17.0)
Immature Granulocytes: 1 %
Lymphocytes Relative: 40 %
Lymphs Abs: 2 10*3/uL (ref 0.7–4.0)
MCH: 32.2 pg (ref 26.0–34.0)
MCHC: 33.2 g/dL (ref 30.0–36.0)
MCV: 96.9 fL (ref 80.0–100.0)
Monocytes Absolute: 0.5 10*3/uL (ref 0.1–1.0)
Monocytes Relative: 11 %
Neutro Abs: 2.2 10*3/uL (ref 1.7–7.7)
Neutrophils Relative %: 44 %
Platelets: 334 10*3/uL (ref 150–400)
RBC: 3.85 MIL/uL — ABNORMAL LOW (ref 4.22–5.81)
RDW: 15 % (ref 11.5–15.5)
WBC: 5 10*3/uL (ref 4.0–10.5)
nRBC: 0 % (ref 0.0–0.2)

## 2021-06-25 LAB — ETHANOL: Alcohol, Ethyl (B): 253 mg/dL — ABNORMAL HIGH (ref <10)

## 2021-06-25 LAB — CBG MONITORING, ED: Glucose-Capillary: 91 mg/dL (ref 70–99)

## 2021-06-25 NOTE — ED Notes (Addendum)
Pt ambulatory without any assistance of staff. Pt maintained steady and equal gait to bathroom and back to bed.

## 2021-06-25 NOTE — Discharge Instructions (Signed)
It was our pleasure to provide your ER care today - we hope that you feel better.  Avoid alcohol use - follow up with AA, and use resource guide provided for additional program resources.   No driving for the next 10 hours, or any time when drinking alcohol.  Follow up with primary care doctor in the next couple weeks.  Return to ER if worse, new symptoms, new/severe pain, fevers, trouble breathing, or other concern.

## 2021-06-25 NOTE — ED Provider Notes (Addendum)
[facility] DEPT Provider Note   CSN: [id number] Arrival date & time: 06/25/21  1300     History  Chief Complaint  Patient presents with   Alcohol Intoxication    David Burnett is a 31 y.o. male.  Patient with suspected etoh intoxication pt was noted walking aside of roadway by bystander, EMS was called. Pt indicated to EMS that he drank a lot of alcohol today and smoked some weed. Pt indicates was celebrating his birthday. Pt denies any pain, trauma, fall or injury. Pt poor historian - level 5 caveat.   The history is provided by the patient, medical records and the EMS personnel. The history is limited by the condition of the patient.  Alcohol Intoxication Pertinent negatives include no chest pain, no abdominal pain, no headaches and no shortness of breath.      Home Medications Prior to Admission medications   Medication Sig Start Date End Date Taking? Authorizing Provider  Multiple Vitamin (MULTIVITAMIN WITH MINERALS) TABS tablet Take 1 tablet by mouth daily. 03/21/17   Bonnielee Haff, MD  ranitidine (ZANTAC) 150 MG capsule Take 1 capsule (150 mg total) by mouth 2 (two) times daily. 03/21/17   Bonnielee Haff, MD      Allergies    Patient has no known allergies.    Review of Systems   Review of Systems  Constitutional:  Negative for fever.  HENT:  Negative for sore throat.   Eyes:  Negative for visual disturbance.  Respiratory:  Negative for shortness of breath.   Cardiovascular:  Negative for chest pain.  Gastrointestinal:  Negative for abdominal pain and vomiting.  Genitourinary:  Negative for flank pain.  Musculoskeletal:  Negative for back pain and neck pain.  Skin:  Negative for wound.  Neurological:  Negative for headaches.  Hematological:  Does not bruise/bleed easily.  Psychiatric/Behavioral:  Negative for agitation.    Physical Exam Updated Vital Signs BP 114/60 (BP Location: Left Arm)    Pulse 75    Temp 98.3 F (36.8 C)  (Oral)    Resp 15    SpO2 100%  Physical Exam Vitals and nursing note reviewed.  Constitutional:      Appearance: Normal appearance. He is well-developed.  HENT:     Head: Atraumatic.     Nose: Nose normal.     Mouth/Throat:     Mouth: Mucous membranes are moist.     Pharynx: Oropharynx is clear.  Eyes:     General: No scleral icterus.    Conjunctiva/sclera: Conjunctivae normal.     Pupils: Pupils are equal, round, and reactive to light.  Neck:     Trachea: No tracheal deviation.  Cardiovascular:     Rate and Rhythm: Normal rate and regular rhythm.     Pulses: Normal pulses.     Heart sounds: Normal heart sounds. No murmur heard.   No friction rub. No gallop.  Pulmonary:     Effort: Pulmonary effort is normal. No accessory muscle usage or respiratory distress.     Breath sounds: Normal breath sounds.  Abdominal:     General: Bowel sounds are normal. There is no distension.     Palpations: Abdomen is soft.     Tenderness: There is no abdominal tenderness.  Genitourinary:    Comments: No cva tenderness. Musculoskeletal:        General: No swelling or tenderness.     Cervical back: Normal range of motion and neck supple. No rigidity.     Comments:  CTLS spine, non tender, aligned, no step off. Good rom bil extremities without pain or focal bony tenderness.   Skin:    General: Skin is warm and dry.     Findings: No rash.  Neurological:     Mental Status: He is alert.     Comments: Alert, responds to questions - poor historian. Motor/sens grossly intact bil.   Psychiatric:     Comments: ?intoxicated.     ED Results / Procedures / Treatments   Labs (all labs ordered are listed, but only abnormal results are displayed) Results for orders placed or performed during the hospital encounter of 06/25/21  Comprehensive metabolic panel  Result Value Ref Range   Sodium 142 135 - 145 mmol/L   Potassium 3.8 3.5 - 5.1 mmol/L   Chloride 109 98 - 111 mmol/L   CO2 26 22 - 32 mmol/L    Glucose, Bld 90 70 - 99 mg/dL   BUN 18 6 - 20 mg/dL   Creatinine, Ser 0.75 0.61 - 1.24 mg/dL   Calcium 8.3 (L) 8.9 - 10.3 mg/dL   Total Protein 6.9 6.5 - 8.1 g/dL   Albumin 3.8 3.5 - 5.0 g/dL   AST 24 15 - 41 U/L   ALT 19 0 - 44 U/L   Alkaline Phosphatase 54 38 - 126 U/L   Total Bilirubin 0.1 (L) 0.3 - 1.2 mg/dL   GFR, Estimated >60 >60 mL/min   Anion gap 7 5 - 15  Ethanol  Result Value Ref Range   Alcohol, Ethyl (B) 253 (H) <10 mg/dL  CBC with Differential  Result Value Ref Range   WBC 5.0 4.0 - 10.5 K/uL   RBC 3.85 (L) 4.22 - 5.81 MIL/uL   Hemoglobin 12.4 (L) 13.0 - 17.0 g/dL   HCT 37.3 (L) 39.0 - 52.0 %   MCV 96.9 80.0 - 100.0 fL   MCH 32.2 26.0 - 34.0 pg   MCHC 33.2 30.0 - 36.0 g/dL   RDW 15.0 11.5 - 15.5 %   Platelets 334 150 - 400 K/uL   nRBC 0.0 0.0 - 0.2 %   Neutrophils Relative % 44 %   Neutro Abs 2.2 1.7 - 7.7 K/uL   Lymphocytes Relative 40 %   Lymphs Abs 2.0 0.7 - 4.0 K/uL   Monocytes Relative 11 %   Monocytes Absolute 0.5 0.1 - 1.0 K/uL   Eosinophils Relative 3 %   Eosinophils Absolute 0.2 0.0 - 0.5 K/uL   Basophils Relative 1 %   Basophils Absolute 0.0 0.0 - 0.1 K/uL   Immature Granulocytes 1 %   Abs Immature Granulocytes 0.03 0.00 - 0.07 K/uL  CBG monitoring, ED  Result Value Ref Range   Glucose-Capillary 91 70 - 99 mg/dL     EKG None  Radiology No results found.  Procedures Procedures    Medications Ordered in ED Medications - No data to display  ED Course/ Medical Decision Making/ A&P                           Medical Decision Making Problems Addressed: Acute alcoholic intoxication without complication [facility]): acute illness or injury with systemic symptoms  Amount and/or Complexity of Data Reviewed Independent Historian: EMS    Details: additional hx provided. External Data Reviewed: labs and notes. Labs: ordered. Decision-making details documented in ED Course.  Risk Diagnosis or treatment significantly limited by social  determinants of health.   Labs sent.   Reviewed nursing  notes and prior charts for additional history. External reports reviewed, additional hx from EMS.  Considered social determinant of health issues in terms of recommending outpatient resources and additional social service resources.   Labs reviewed/interpreted by me - glucose normal. Etoh high.   Po fluids/food.   Recheck, resting, easily aroused, no new c/o.   Will provide food/drink and give time to metabolize.  Recheck, pt has eaten/drank, and has been ambulatory in ED with steady gait. Pt requesting d/c. No physical c/o.  Pt currently appears stable for d/c.   Rec pcp f/u.  Return precautions provided.          Final Clinical Impression(s) / ED Diagnoses Final diagnoses:  None    Rx / DC Orders ED Discharge Orders     None             Lajean Saver, MD 06/25/21 1536

## 2021-06-25 NOTE — ED Notes (Signed)
Provided pt with bus pass.

## 2021-06-25 NOTE — ED Notes (Signed)
I provided reinforced discharge education based off of discharge instructions. Pt acknowledged and understood my education. Pt had no further questions/concerns for provider/myself.  °

## 2021-06-25 NOTE — ED Triage Notes (Signed)
Per EMS-found laying on side of road-intoxicated via ETOH and THC-bystander called EMS

## 2021-06-25 NOTE — ED Notes (Signed)
Pt is currently awake, stating "I'm ready to go, I'm sorry that I had to be here" Provided pt with [place] sandwich.

## 2021-06-25 NOTE — ED Notes (Signed)
Provided pt with cola.

## 2021-06-25 NOTE — ED Provider Triage Note (Addendum)
Emergency Medicine Provider Triage Evaluation Note  David Burnett , a 31 y.o. male  was evaluated in triage.  Pt complains of alcohol use.  He was found on the side of the road after a passerby called 911. EMS states that patient reports he drank a lot of alcohol and maybe smoked some weed.  Denies any injuries.  He states he was having dollar shots for his birthday.   He denies headache.   Physical Exam  BP 114/60 (BP Location: Left Arm)    Pulse 75    Temp 98.3 F (36.8 C) (Oral)    Resp 15    SpO2 100%  Gen:   Patient is somnolent, awakens to voice before falling back asleep.  Resp:  Normal effort  MSK:   Moves extremities without difficulty  Other:  His speech is slurred but he is able to answer questions piror to falling asleep. Moves all extremities spontaneously.   Medical Decision Making  Medically screening exam initiated at 1:13 PM.  Appropriate orders placed.  Dhanush Jokerst was informed that the remainder of the evaluation will be completed by another provider, this initial triage assessment does not replace that evaluation, and the importance of remaining in the ED until their evaluation is complete.  Labs are ordered to confirm intoxication as patient is unreliable historian and altered.   Note: Portions of this report may have been transcribed using voice recognition software. Every effort was made to ensure accuracy; however, inadvertent computerized transcription errors may be present    Lorin Glass, PA-C 06/25/21 1313    Lorin Glass, [place] 06/25/21 1318

## 2021-07-08 ENCOUNTER — Other Ambulatory Visit: Payer: Self-pay

## 2021-07-08 ENCOUNTER — Emergency Department (HOSPITAL_COMMUNITY)
Admission: EM | Admit: 2021-07-08 | Discharge: 2021-07-08 | Disposition: A | Discharge status: Home / Self Care | Payer: 59 | Attending: Emergency Medicine | Admitting: Emergency Medicine

## 2021-07-08 ENCOUNTER — Encounter (HOSPITAL_COMMUNITY): Payer: Self-pay

## 2021-07-08 DIAGNOSIS — Y908 Blood alcohol level of 240 mg/100 ml or more: Secondary | ICD-10-CM | POA: Diagnosis not present

## 2021-07-08 DIAGNOSIS — F10920 Alcohol use, unspecified with intoxication, uncomplicated: Secondary | ICD-10-CM

## 2021-07-08 DIAGNOSIS — F10929 Alcohol use, unspecified with intoxication, unspecified: Secondary | ICD-10-CM | POA: Diagnosis present

## 2021-07-08 LAB — CBC WITH DIFFERENTIAL/PLATELET
Abs Immature Granulocytes: 0.01 10*3/uL (ref 0.00–0.07)
Basophils Absolute: 0 10*3/uL (ref 0.0–0.1)
Basophils Relative: 1 %
Eosinophils Absolute: 0.1 10*3/uL (ref 0.0–0.5)
Eosinophils Relative: 3 %
HCT: 38.3 % — ABNORMAL LOW (ref 39.0–52.0)
Hemoglobin: 12.4 g/dL — ABNORMAL LOW (ref 13.0–17.0)
Immature Granulocytes: 0 %
Lymphocytes Relative: 42 %
Lymphs Abs: 2 10*3/uL (ref 0.7–4.0)
MCH: 32 pg (ref 26.0–34.0)
MCHC: 32.4 g/dL (ref 30.0–36.0)
MCV: 99 fL (ref 80.0–100.0)
Monocytes Absolute: 0.9 10*3/uL (ref 0.1–1.0)
Monocytes Relative: 18 %
Neutro Abs: 1.7 10*3/uL (ref 1.7–7.7)
Neutrophils Relative %: 36 %
Platelets: 282 10*3/uL (ref 150–400)
RBC: 3.87 MIL/uL — ABNORMAL LOW (ref 4.22–5.81)
RDW: 15 % (ref 11.5–15.5)
WBC: 4.8 10*3/uL (ref 4.0–10.5)
nRBC: 0 % (ref 0.0–0.2)

## 2021-07-08 LAB — ETHANOL: Alcohol, Ethyl (B): 293 mg/dL — ABNORMAL HIGH (ref <10)

## 2021-07-08 MED ORDER — SODIUM CHLORIDE 0.9 % IV BOLUS
1000.0000 mL | Freq: Once | INTRAVENOUS | Status: AC
  Administered 2021-07-08: 1000 mL via INTRAVENOUS

## 2021-07-08 NOTE — ED Notes (Addendum)
Pt removed IV and walked to bathroom. He is talking and following commands. MD at bedside to assess. Pt dressed himself and is eating his "to go" box from restaurant he was at when staff called EMS.

## 2021-07-08 NOTE — ED Provider Notes (Signed)
[facility] DEPT Provider Note   CSN: [id number] Arrival date & time: 07/08/21  2103     History  Chief Complaint  Patient presents with   Alcohol Intoxication    David Burnett is a 31 y.o. male history of alcohol abuse here presenting with alcohol intoxication.  Patient was found at the restaurant and apparently was noted to be drunk.  Patient unable to give history as he was too intoxicated.  Patient has no obvious head injury as noted by EMS.  Patient was seen here previously for similar symptoms  The history is provided by the patient and the EMS personnel.      Home Medications Prior to Admission medications   Medication Sig Start Date End Date Taking? Authorizing Provider  Multiple Vitamin (MULTIVITAMIN WITH MINERALS) TABS tablet Take 1 tablet by mouth daily. 03/21/17   Bonnielee Haff, MD  ranitidine (ZANTAC) 150 MG capsule Take 1 capsule (150 mg total) by mouth 2 (two) times daily. 03/21/17   Bonnielee Haff, MD      Allergies    Patient has no known allergies.    Review of Systems   Review of Systems  All other systems reviewed and are negative.  Physical Exam Updated Vital Signs There were no vitals taken for this visit. Physical Exam Vitals and nursing note reviewed.  Constitutional:      Comments: Intoxicated  HENT:     Head: Normocephalic and atraumatic.     Comments: No obvious head injury    Mouth/Throat:     Mouth: Mucous membranes are dry.  Eyes:     Extraocular Movements: Extraocular movements intact.     Pupils: Pupils are equal, round, and reactive to light.  Cardiovascular:     Rate and Rhythm: Normal rate and regular rhythm.     Pulses: Normal pulses.     Heart sounds: Normal heart sounds.  Pulmonary:     Effort: Pulmonary effort is normal.     Breath sounds: Normal breath sounds.  Abdominal:     General: Abdomen is flat.     Palpations: Abdomen is soft.  Musculoskeletal:        General: Normal range of  motion.     Cervical back: Normal range of motion and neck supple.  Skin:    General: Skin is warm.     Capillary Refill: Capillary refill takes less than 2 seconds.  Neurological:     Comments: Intoxicated, moving all extremities    ED Results / Procedures / Treatments   Labs (all labs ordered are listed, but only abnormal results are displayed) Labs Reviewed  ETHANOL  CBC WITH DIFFERENTIAL/PLATELET  COMPREHENSIVE METABOLIC PANEL    EKG None  Radiology No results found.  Procedures Procedures    Medications Ordered in ED Medications  sodium chloride 0.9 % bolus 1,000 mL (has no administration in time range)    ED Course/ Medical Decision Making/ A&P                           Medical Decision Making David Burnett is a 31 y.o. male here with alcohol intoxication.  Patient apparently was found drinking alcohol and appears visibly intoxicated.  Patient has no obvious signs of trauma on my exam.  Plan to get CBC and CMP and alcohol level.  Will hydrate patient and observe until sober.  Patient has no family or friends to pick him up   11:07 PM ETOH is  293.  Patient given IV fluids and now is awake and alert and eating his dinner.  He is able to ambulate to the bathroom by himself.  He states that he is homeless.  I gave him resources and told him to stop drinking alcohol  Problems Addressed: Alcoholic intoxication without complication [facility]): acute illness or injury  Amount and/or Complexity of Data Reviewed Labs: ordered.   Final Clinical Impression(s) / ED Diagnoses Final diagnoses:  None    Rx / DC Orders ED Discharge Orders     None         Drenda Freeze, MD 07/08/21 2316

## 2021-07-08 NOTE — ED Triage Notes (Addendum)
PT BIB EMS for ETOH. Pt from a restaurant, who called EMS for public intoxication. VSS.  CBG 102

## 2021-07-08 NOTE — Discharge Instructions (Signed)
Stop drinking alcohol   See homeless shelter resources  Return to ER if you have a fall, thoughts of harming yourself or others

## 2021-07-09 LAB — COMPREHENSIVE METABOLIC PANEL
ALT: 33 U/L (ref 0–44)
AST: 50 U/L — ABNORMAL HIGH (ref 15–41)
Albumin: 3.8 g/dL (ref 3.5–5.0)
Alkaline Phosphatase: 63 U/L (ref 38–126)
Anion gap: 7 (ref 5–15)
BUN: 15 mg/dL (ref 6–20)
CO2: 25 mmol/L (ref 22–32)
Calcium: 8.3 mg/dL — ABNORMAL LOW (ref 8.9–10.3)
Chloride: 107 mmol/L (ref 98–111)
Creatinine, Ser: 0.84 mg/dL (ref 0.61–1.24)
GFR, Estimated: >60 mL/min (ref >60)
Glucose, Bld: 86 mg/dL (ref 70–99)
Potassium: 3.8 mmol/L (ref 3.5–5.1)
Sodium: 139 mmol/L (ref 135–145)
Total Bilirubin: 0.5 mg/dL (ref 0.3–1.2)
Total Protein: 6.6 g/dL (ref 6.5–8.1)

## 2021-07-12 ENCOUNTER — Encounter (HOSPITAL_COMMUNITY): Payer: Self-pay

## 2021-07-12 ENCOUNTER — Emergency Department (HOSPITAL_COMMUNITY)
Admission: EM | Admit: 2021-07-12 | Discharge: 2021-07-13 | Disposition: A | Discharge status: Home / Self Care | Payer: 59 | Attending: Emergency Medicine | Admitting: Emergency Medicine

## 2021-07-12 ENCOUNTER — Other Ambulatory Visit: Payer: Self-pay

## 2021-07-12 DIAGNOSIS — F10920 Alcohol use, unspecified with intoxication, uncomplicated: Secondary | ICD-10-CM

## 2021-07-12 DIAGNOSIS — F10129 Alcohol abuse with intoxication, unspecified: Secondary | ICD-10-CM | POA: Diagnosis present

## 2021-07-12 DIAGNOSIS — Z5941 Food insecurity: Secondary | ICD-10-CM | POA: Insufficient documentation

## 2021-07-12 NOTE — ED Triage Notes (Signed)
BIBA Per EMS: Pt coming from side of street. Pt reports taking multiple shots and needing a place to sleep. VSS.

## 2021-07-13 NOTE — ED Notes (Signed)
Pt ambulatory to restroom. Steady gait noted.

## 2021-07-13 NOTE — ED Provider Notes (Signed)
°  [facility] DEPT Provider Note   CSN: [id number] Arrival date & time: 07/12/21  2121     History  Chief Complaint  Patient presents with   Alcohol Intoxication    David Burnett is a 31 y.o. male.  The history is provided by the patient.  Alcohol Intoxication David Burnett is a 31 y.o. male who presents to the Emergency Department complaining of alcohol intoxication.  Patient presents to the emergency department by EMS for intoxication, needing a place to sleep.  Patient states that he drank alcohol tonight and is tired and hungry.  Denies any pain or recent illnesses.  Denies any SI, HI.     Home Medications Prior to Admission medications   Medication Sig Start Date End Date Taking? Authorizing Provider  Multiple Vitamin (MULTIVITAMIN WITH MINERALS) TABS tablet Take 1 tablet by mouth daily. 03/21/17   Bonnielee Haff, MD  ranitidine (ZANTAC) 150 MG capsule Take 1 capsule (150 mg total) by mouth 2 (two) times daily. 03/21/17   Bonnielee Haff, MD      Allergies    Patient has no known allergies.    Review of Systems   Review of Systems  All other systems reviewed and are negative.  Physical Exam Updated Vital Signs BP 135/77 (BP Location: Left Arm)    Pulse 92    Temp 97.7 F (36.5 C) (Oral)    Resp 16    SpO2 100%  Physical Exam Vitals and nursing note reviewed.  Constitutional:      Appearance: He is well-developed.     Comments: Drowsy but conversant.  Disheveled  HENT:     Head: Normocephalic and atraumatic.  Cardiovascular:     Rate and Rhythm: Normal rate and regular rhythm.  Pulmonary:     Effort: Pulmonary effort is normal. No respiratory distress.  Musculoskeletal:        General: No tenderness.  Skin:    General: Skin is warm and dry.  Neurological:     Mental Status: He is oriented to person, place, and time.     Comments: 5 out of 5 strength in all 4 extremities  Psychiatric:        Behavior: Behavior normal.    ED  Results / Procedures / Treatments   Labs (all labs ordered are listed, but only abnormal results are displayed) Labs Reviewed - No data to display  EKG None  Radiology No results found.  Procedures Procedures    Medications Ordered in ED Medications - No data to display  ED Course/ Medical Decision Making/ A&P                           Medical Decision Making  Patient here for evaluation of alcohol intoxication, requesting sandwich and a place to sleep.  Patient sleeping on initial assessment with only complaint being wanting something to eat.  He was provided with 2 sandwiches as well as cheese and crackers in the emergency department.  He is able to ambulate without difficulty and has no additional acute complaints.  Plan to discharge with outpatient resources.        Final Clinical Impression(s) / ED Diagnoses Final diagnoses:  Alcoholic intoxication without complication [facility])  Food insecurity    Rx / DC Orders ED Discharge Orders     None         Quintella Reichert, MD 07/13/21 [phone]

## 2021-07-13 NOTE — ED Notes (Signed)
Pt given 2 sandwiches, crackers, and cheese.

## 2021-07-23 ENCOUNTER — Emergency Department (HOSPITAL_COMMUNITY): Payer: 59

## 2021-07-23 ENCOUNTER — Emergency Department (HOSPITAL_COMMUNITY)
Admission: EM | Admit: 2021-07-23 | Discharge: 2021-07-24 | Disposition: A | Discharge status: Home / Self Care | Payer: 59 | Source: Home / Self Care | Attending: Emergency Medicine | Admitting: Emergency Medicine

## 2021-07-23 ENCOUNTER — Other Ambulatory Visit: Payer: Self-pay

## 2021-07-23 ENCOUNTER — Emergency Department (HOSPITAL_COMMUNITY)
Admission: EM | Admit: 2021-07-23 | Discharge: 2021-07-23 | Disposition: A | Discharge status: Home / Self Care | Payer: 59 | Attending: Emergency Medicine | Admitting: Emergency Medicine

## 2021-07-23 ENCOUNTER — Encounter (HOSPITAL_COMMUNITY): Payer: Self-pay | Admitting: Emergency Medicine

## 2021-07-23 DIAGNOSIS — R4182 Altered mental status, unspecified: Secondary | ICD-10-CM | POA: Diagnosis not present

## 2021-07-23 DIAGNOSIS — R069 Unspecified abnormalities of breathing: Secondary | ICD-10-CM | POA: Diagnosis not present

## 2021-07-23 DIAGNOSIS — R471 Dysarthria and anarthria: Secondary | ICD-10-CM | POA: Diagnosis not present

## 2021-07-23 DIAGNOSIS — R710 Precipitous drop in hematocrit: Secondary | ICD-10-CM | POA: Diagnosis not present

## 2021-07-23 DIAGNOSIS — Y907 Blood alcohol level of 200-239 mg/100 ml: Secondary | ICD-10-CM | POA: Insufficient documentation

## 2021-07-23 DIAGNOSIS — F10129 Alcohol abuse with intoxication, unspecified: Secondary | ICD-10-CM | POA: Insufficient documentation

## 2021-07-23 DIAGNOSIS — F10929 Alcohol use, unspecified with intoxication, unspecified: Secondary | ICD-10-CM

## 2021-07-23 DIAGNOSIS — R5383 Other fatigue: Secondary | ICD-10-CM | POA: Insufficient documentation

## 2021-07-23 DIAGNOSIS — R439 Unspecified disturbances of smell and taste: Secondary | ICD-10-CM | POA: Diagnosis not present

## 2021-07-23 DIAGNOSIS — Y908 Blood alcohol level of 240 mg/100 ml or more: Secondary | ICD-10-CM | POA: Diagnosis not present

## 2021-07-23 DIAGNOSIS — F10920 Alcohol use, unspecified with intoxication, uncomplicated: Secondary | ICD-10-CM

## 2021-07-23 LAB — CBC WITH DIFFERENTIAL/PLATELET
Abs Immature Granulocytes: 0 10*3/uL (ref 0.00–0.07)
Abs Immature Granulocytes: 0.01 10*3/uL (ref 0.00–0.07)
Basophils Absolute: 0 10*3/uL (ref 0.0–0.1)
Basophils Absolute: 0.1 10*3/uL (ref 0.0–0.1)
Basophils Relative: 1 %
Basophils Relative: 1 %
Eosinophils Absolute: 0.1 10*3/uL (ref 0.0–0.5)
Eosinophils Absolute: 0.2 10*3/uL (ref 0.0–0.5)
Eosinophils Relative: 3 %
Eosinophils Relative: 4 %
HCT: 30.9 % — ABNORMAL LOW (ref 39.0–52.0)
HCT: 40.4 % (ref 39.0–52.0)
Hemoglobin: 10.2 g/dL — ABNORMAL LOW (ref 13.0–17.0)
Hemoglobin: 13.6 g/dL (ref 13.0–17.0)
Immature Granulocytes: 0 %
Immature Granulocytes: 0 %
Lymphocytes Relative: 50 %
Lymphocytes Relative: 44 %
Lymphs Abs: 2 10*3/uL (ref 0.7–4.0)
Lymphs Abs: 2.3 10*3/uL (ref 0.7–4.0)
MCH: 32.9 pg (ref 26.0–34.0)
MCH: 32.6 pg (ref 26.0–34.0)
MCHC: 33 g/dL (ref 30.0–36.0)
MCHC: 33.7 g/dL (ref 30.0–36.0)
MCV: 99.7 fL (ref 80.0–100.0)
MCV: 96.9 fL (ref 80.0–100.0)
Monocytes Absolute: 0.5 10*3/uL (ref 0.1–1.0)
Monocytes Absolute: 0.5 10*3/uL (ref 0.1–1.0)
Monocytes Relative: 14 %
Monocytes Relative: 9 %
Neutro Abs: 1.2 10*3/uL — ABNORMAL LOW (ref 1.7–7.7)
Neutro Abs: 2.1 10*3/uL (ref 1.7–7.7)
Neutrophils Relative %: 32 %
Neutrophils Relative %: 42 %
Platelets: 201 10*3/uL (ref 150–400)
Platelets: 289 10*3/uL (ref 150–400)
RBC: 3.1 MIL/uL — ABNORMAL LOW (ref 4.22–5.81)
RBC: 4.17 MIL/uL — ABNORMAL LOW (ref 4.22–5.81)
RDW: 15.3 % (ref 11.5–15.5)
RDW: 15.5 % (ref 11.5–15.5)
WBC: 3.8 10*3/uL — ABNORMAL LOW (ref 4.0–10.5)
WBC: 5.1 10*3/uL (ref 4.0–10.5)
nRBC: 0 % (ref 0.0–0.2)
nRBC: 0 % (ref 0.0–0.2)

## 2021-07-23 LAB — HEPATIC FUNCTION PANEL
ALT: 36 U/L (ref 0–44)
AST: 52 U/L — ABNORMAL HIGH (ref 15–41)
Albumin: 3.8 g/dL (ref 3.5–5.0)
Alkaline Phosphatase: 55 U/L (ref 38–126)
Bilirubin, Direct: 0.3 mg/dL — ABNORMAL HIGH (ref 0.0–0.2)
Indirect Bilirubin: 0.2 mg/dL — ABNORMAL LOW (ref 0.3–0.9)
Total Bilirubin: 0.5 mg/dL (ref 0.3–1.2)
Total Protein: 6.7 g/dL (ref 6.5–8.1)

## 2021-07-23 LAB — COMPREHENSIVE METABOLIC PANEL
ALT: 34 U/L (ref 0–44)
AST: 48 U/L — ABNORMAL HIGH (ref 15–41)
Albumin: 4.3 g/dL (ref 3.5–5.0)
Alkaline Phosphatase: 62 U/L (ref 38–126)
Anion gap: 7 (ref 5–15)
BUN: 18 mg/dL (ref 6–20)
CO2: 29 mmol/L (ref 22–32)
Calcium: 8.8 mg/dL — ABNORMAL LOW (ref 8.9–10.3)
Chloride: 107 mmol/L (ref 98–111)
Creatinine, Ser: 0.93 mg/dL (ref 0.61–1.24)
GFR, Estimated: >60 mL/min (ref >60)
Glucose, Bld: 93 mg/dL (ref 70–99)
Potassium: 4.1 mmol/L (ref 3.5–5.1)
Sodium: 143 mmol/L (ref 135–145)
Total Bilirubin: 0.6 mg/dL (ref 0.3–1.2)
Total Protein: 7.7 g/dL (ref 6.5–8.1)

## 2021-07-23 LAB — RAPID URINE DRUG SCREEN, HOSP PERFORMED
Amphetamines: NOT DETECTED
Barbiturates: NOT DETECTED
Benzodiazepines: NOT DETECTED
Cocaine: NOT DETECTED
Opiates: NOT DETECTED
Tetrahydrocannabinol: NOT DETECTED

## 2021-07-23 LAB — BASIC METABOLIC PANEL
Anion gap: 7 (ref 5–15)
BUN: 15 mg/dL (ref 6–20)
CO2: 26 mmol/L (ref 22–32)
Calcium: 8.5 mg/dL — ABNORMAL LOW (ref 8.9–10.3)
Chloride: 106 mmol/L (ref 98–111)
Creatinine, Ser: 0.83 mg/dL (ref 0.61–1.24)
GFR, Estimated: >60 mL/min (ref >60)
Glucose, Bld: 95 mg/dL (ref 70–99)
Potassium: 4 mmol/L (ref 3.5–5.1)
Sodium: 139 mmol/L (ref 135–145)

## 2021-07-23 LAB — CBG MONITORING, ED
Glucose-Capillary: 93 mg/dL (ref 70–99)
Glucose-Capillary: 90 mg/dL (ref 70–99)

## 2021-07-23 LAB — ETHANOL
Alcohol, Ethyl (B): 283 mg/dL — ABNORMAL HIGH (ref <10)
Alcohol, Ethyl (B): 422 mg/dL (ref <10)

## 2021-07-23 LAB — ACETAMINOPHEN LEVEL: Acetaminophen (Tylenol), Serum: <10 ug/mL — ABNORMAL LOW (ref 10–30)

## 2021-07-23 LAB — SALICYLATE LEVEL: Salicylate Lvl: <7 mg/dL — ABNORMAL LOW (ref 7.0–30.0)

## 2021-07-23 MED ORDER — SODIUM CHLORIDE 0.9 % IV BOLUS
1000.0000 mL | Freq: Once | INTRAVENOUS | Status: AC
  Administered 2021-07-23: 1000 mL via INTRAVENOUS

## 2021-07-23 MED ORDER — THIAMINE HCL 100 MG/ML IJ SOLN: 100.0000 mg | Freq: Every day | INTRAMUSCULAR | Status: DC

## 2021-07-23 MED ORDER — NALOXONE HCL 2 MG/2ML IJ SOSY
1.0000 mg | PREFILLED_SYRINGE | Freq: Once | INTRAMUSCULAR | Status: AC
  Administered 2021-07-23: 1 mg via INTRAVENOUS
  Filled 2021-07-23: qty 2

## 2021-07-23 MED ORDER — SODIUM CHLORIDE 0.9 % IV SOLN: INTRAVENOUS | Status: DC

## 2021-07-23 MED ORDER — ONDANSETRON HCL 4 MG/2ML IJ SOLN
4.0000 mg | Freq: Once | INTRAMUSCULAR | Status: AC
  Administered 2021-07-23: 4 mg via INTRAVENOUS
  Filled 2021-07-23: qty 2

## 2021-07-23 NOTE — ED Provider Notes (Signed)
?[facility] DEPT ?Provider Note ? ? ?CSN: [id number] ?Arrival date & time: 07/23/21  0111 ? ?  ? ?History ? ?Chief Complaint  ?Patient presents with  ? Alcohol Intoxication  ? ? ?David Burnett is a 31 y.o. male with past medical history of alcohol use.  Brought to the emergency department by EMS after he was found laying down outside of a restaurant.  Patient reports that he was sleeping outside of a restaurant when police officers and EMS woke him up.  Patient reports that he came to the emergency department due to concerns for breathing difficulties.  Patient is unable to explain this further.  He denies any other complaints.   ? ?Patient endorses alcohol use.  He is unable to specify how much alcohol he had to drink today.  Denies any illicit drug use.  Patient does endorse homelessness. ? ? ?Alcohol Intoxication ?Pertinent negatives include no chest pain, no abdominal pain, no headaches and no shortness of breath.  ? ?  ? ?Home Medications ?Prior to Admission medications   ?Medication Sig Start Date End Date Taking? Authorizing Provider  ?Multiple Vitamin (MULTIVITAMIN WITH MINERALS) TABS tablet Take 1 tablet by mouth daily. 03/21/17   Bonnielee Haff, MD  ?ranitidine (ZANTAC) 150 MG capsule Take 1 capsule (150 mg total) by mouth 2 (two) times daily. 03/21/17   Bonnielee Haff, MD  ?   ? ?Allergies    ?Patient has no known allergies.   ? ?Review of Systems   ?Review of Systems  ?Constitutional:  Negative for chills and fever.  ?Eyes:  Negative for visual disturbance.  ?Respiratory:  Negative for shortness of breath.   ?Cardiovascular:  Negative for chest pain.  ?Gastrointestinal:  Negative for abdominal pain, nausea and vomiting.  ?Genitourinary:  Negative for difficulty urinating and dysuria.  ?Musculoskeletal:  Negative for back pain and neck pain.  ?Skin:  Negative for color change and rash.  ?Neurological:  Negative for dizziness, syncope, light-headedness and headaches.   ?Psychiatric/Behavioral:  Negative for confusion.   ? ?Physical Exam ?Updated Vital Signs ?BP 115/72   Pulse 73   Temp (!) 97.5 ?F (36.4 ?C) (Oral)   Resp 18   Ht '5\' 5"'$  (1.651 m)   Wt 68 kg   SpO2 100%   BMI 24.96 kg/m?  ?Physical Exam ?Vitals and nursing note reviewed.  ?Constitutional:   ?   General: He is not in acute distress. ?   Appearance: He is not ill-appearing, toxic-appearing or diaphoretic.  ?   Comments: Smelled strongly of alcohol  ?HENT:  ?   Head: Normocephalic and atraumatic. No raccoon eyes, abrasion, contusion or laceration.  ?Eyes:  ?   General: No scleral icterus.    ?   Right eye: No discharge.     ?   Left eye: No discharge.  ?   Conjunctiva/sclera:  ?   Right eye: Right conjunctiva is injected.  ?   Left eye: Left conjunctiva is injected.  ?   Pupils: Pupils are equal, round, and reactive to light.  ?Cardiovascular:  ?   Rate and Rhythm: Normal rate.  ?   Pulses:     ?     Radial pulses are 2+ on the right side and 2+ on the left side.  ?Pulmonary:  ?   Effort: Pulmonary effort is normal. No tachypnea or respiratory distress.  ?   Breath sounds: Normal breath sounds. No stridor.  ?Abdominal:  ?   General: Abdomen is flat. Bowel  sounds are normal.  ?   Palpations: Abdomen is soft. There is no mass or pulsatile mass.  ?   Tenderness: There is no abdominal tenderness. There is no guarding or rebound.  ?Musculoskeletal:  ?   Comments: No tenderness, bony tenderness, or deformity to bilateral upper or lower extremities.  No midline tenderness to cervical, thoracic, lumbar spine.  ?Skin: ?   General: Skin is warm and dry.  ?Neurological:  ?   General: No focal deficit present.  ?   Mental Status: He is alert and oriented to person, place, and time.  ?   GCS: GCS eye subscore is 4. GCS verbal subscore is 5. GCS motor subscore is 6.  ?   Cranial Nerves: Dysarthria present.  ?   Comments: Patient has slurred speech  ?Psychiatric:     ?   Behavior: Behavior is cooperative.  ? ? ?ED Results /  Procedures / Treatments   ?Labs ?(all labs ordered are listed, but only abnormal results are displayed) ?Labs Reviewed  ?ETHANOL - Abnormal; Notable for the following components:  ?    Result Value  ? Alcohol, Ethyl (B) 283 (*)   ? All other components within normal limits  ?BASIC METABOLIC PANEL - Abnormal; Notable for the following components:  ? Calcium 8.5 (*)   ? All other components within normal limits  ?CBC WITH DIFFERENTIAL/PLATELET - Abnormal; Notable for the following components:  ? WBC 3.8 (*)   ? RBC 3.10 (*)   ? Hemoglobin 10.2 (*)   ? HCT 30.9 (*)   ? Neutro Abs 1.2 (*)   ? All other components within normal limits  ?HEPATIC FUNCTION PANEL - Abnormal; Notable for the following components:  ? AST 52 (*)   ? Bilirubin, Direct 0.3 (*)   ? Indirect Bilirubin 0.2 (*)   ? All other components within normal limits  ?CBC WITH DIFFERENTIAL/PLATELET  ?CBG MONITORING, ED  ?   ? ?EKG ?None ? ?Radiology ?DG Chest Portable 1 View ? ?Result Date: 07/23/2021 ?CLINICAL DATA:  Unresponsive with breathing difficulty, initial encounter EXAM: PORTABLE CHEST 1 VIEW COMPARISON:  03/20/2017 FINDINGS: The heart size and mediastinal contours are within normal limits. Both lungs are clear. The visualized skeletal structures are unremarkable. IMPRESSION: No active disease. Electronically Signed   By: Inez Catalina M.D.   On: 07/23/2021 02:05   ? ?Procedures ?Procedures  ? ? ?Medications Ordered in ED ?Medications  ?sodium chloride 0.9 % bolus 1,000 mL (0 mLs Intravenous Stopped 07/23/21 0359)  ? ? ?ED Course/ Medical Decision Making/ A&P ?  ?                        ?Medical Decision Making ?Amount and/or Complexity of Data Reviewed ?Labs: ordered. ?Radiology: ordered. ? ? ?Alert and intoxicated 31 year old male in no acute distress, nontoxic-appearing.  Patient noted to have strong smell odor of alcohol.  Noted to have dysarthria.  Brought to the emergency department by EMS after being found sleeping outside of a restaurant.  At  present patient complains of breathing difficulties but is unable to explain further. ? ?Information obtained from patient.  Past medical records reviewed including previous provider notes, labs, and imaging.  Patient has history of alcohol use which complicates his care. ? ?Patient is extremely vague about his pain difficulties and denies any other complaints.  Lungs are clear to auscultation bilaterally.  Will order chest x-ray to evaluate for possible pneumothorax.   ? ?Patient  endorses alcohol use.  Unable to specify how much she has had to drink.  Will obtain CBG, CMP, CBC and ethanol.  We will give patient 1 L fluid bolus.  Patient has no obvious injuries, head is atraumatic. ? ?X-ray imaging was viewed and independently interpreted by myself.  I agree with radiology interpretation of no active cardiopulmonary disease. ? ?Labs were independently reviewed and interpreted by myself.  Pertinent findings include: ?-EtOH 283 ?-Hemoglobin 10.2, hematocrit 30.9 ?-AST 52, ALT within normal limits ?-CBG 93 ? ?On serial reevaluation patient sleeping comfortably however easily aroused.  Upon waking patient requesting food.  We will give patient sandwich.  Patient able to stand and ambulate without difficulty.  With patient being homeless and followed by Police Department suspect that vague complaint of breathing difficulties may have been due to taking secondary gain from shelter.  We will plan to discharge patient at this time.  Patient given duration to follow-up with [facility] and wellness clinic for repeat evaluation of his CBC in outpatient setting.  Given resources for shelters and substance use guides. ? ?Discussed results, findings, treatment and follow up. Patient advised of return precautions. Patient verbalized understanding and agreed with plan. ? ? ? ? ? ? ? ? ? ? ?Final Clinical Impression(s) / ED Diagnoses ?Final diagnoses:  ?None  ? ? ?Rx / DC Orders ?ED Discharge Orders   ? ? None  ? ?  ? ? ?   ?Loni Beckwith, PA-C ?07/23/21 0407 ? ?  ?Fatima Blank, MD ?07/23/21 0730 ? ?

## 2021-07-23 NOTE — ED Notes (Signed)
Patient does not respond to sternal rub.  ?

## 2021-07-23 NOTE — ED Notes (Signed)
Patient's mouth had to be suctioned d/t vomiting.  Patient actively spitting out vomit at this nurse but unable/unwilling to follow commands to stop.  ?

## 2021-07-23 NOTE — ED Triage Notes (Signed)
BIB by EMS c/o drug OD at the [facility].  FD gave 1 mg IN of narcan, patient has been sleeping since. ? ?CBG 89 ?79 hr ?99% RA ?102/60 ?

## 2021-07-23 NOTE — ED Provider Notes (Signed)
?[facility] DEPT ?Provider Note ? ? ?CSN: [id number] ?Arrival date & time: 07/23/21  1946 ? ?  ? ?History ? ?Chief Complaint  ?Patient presents with  ? Drug Overdose  ? ? ?David Burnett is a 31 y.o. male. ? ? ?Drug Overdose ? ? ?Patient has a history of homelessness, nontraumatic rhabdomyolysis and recurrent alcohol intoxication.  Prior records reviewed and the patient was seen 3 times in January and 3 times in February for acute alcohol intoxication in the emergency room.  Patient presents today for altered mental status.  There is question of possible drug overdose as EMS gave the patient Narcan.  Patient is extremely somnolent and is not answering any my questions to provide any additional history ? ?Home Medications ?Prior to Admission medications   ?Medication Sig Start Date End Date Taking? Authorizing Provider  ?Multiple Vitamin (MULTIVITAMIN WITH MINERALS) TABS tablet Take 1 tablet by mouth daily. 03/21/17   Bonnielee Haff, MD  ?ranitidine (ZANTAC) 150 MG capsule Take 1 capsule (150 mg total) by mouth 2 (two) times daily. 03/21/17   Bonnielee Haff, MD  ?   ? ?Allergies    ?Patient has no known allergies.   ? ?Review of Systems   ?Review of Systems  ?Unable to perform ROS: Mental status change  ? ?Physical Exam ?Updated Vital Signs ?BP 132/90   Pulse 83   Resp 14   SpO2 90%  ?Physical Exam ?Vitals and nursing note reviewed.  ?Constitutional:   ?   Appearance: He is well-developed.  ?HENT:  ?   Head: Normocephalic and atraumatic.  ?   Right Ear: External ear normal.  ?   Left Ear: External ear normal.  ?Eyes:  ?   General: No scleral icterus.    ?   Right eye: No discharge.     ?   Left eye: No discharge.  ?   Conjunctiva/sclera: Conjunctivae normal.  ?   Comments: Pupils are pinpoint bilaterally  ?Neck:  ?   Trachea: No tracheal deviation.  ?Cardiovascular:  ?   Rate and Rhythm: Normal rate and regular rhythm.  ?Pulmonary:  ?   Effort: Pulmonary effort is normal. No respiratory  distress.  ?   Breath sounds: Normal breath sounds. No stridor. No wheezing or rales.  ?   Comments: Sonorous respirations ?Abdominal:  ?   General: Bowel sounds are normal. There is no distension.  ?   Palpations: Abdomen is soft.  ?   Tenderness: There is no abdominal tenderness. There is no guarding or rebound.  ?Musculoskeletal:     ?   General: No tenderness or deformity.  ?   Cervical back: Neck supple.  ?Skin: ?   General: Skin is warm and dry.  ?   Findings: No rash.  ?Neurological:  ?   General: No focal deficit present.  ?   Mental Status: He is lethargic.  ?   Cranial Nerves: No cranial nerve deficit (no facial droop, extraocular movements intact, no slurred speech).  ?   Sensory: No sensory deficit.  ?   Motor: No abnormal muscle tone or seizure activity.  ?   Coordination: Coordination normal.  ?Psychiatric:     ?   Mood and Affect: Mood normal.  ? ? ?ED Results / Procedures / Treatments   ?Labs ?(all labs ordered are listed, but only abnormal results are displayed) ?Labs Reviewed  ?COMPREHENSIVE METABOLIC PANEL - Abnormal; Notable for the following components:  ?    Result Value  ?  Calcium 8.8 (*)   ? AST 48 (*)   ? All other components within normal limits  ?SALICYLATE LEVEL - Abnormal; Notable for the following components:  ? Salicylate Lvl <3.3 (*)   ? All other components within normal limits  ?ACETAMINOPHEN LEVEL - Abnormal; Notable for the following components:  ? Acetaminophen (Tylenol), Serum <10 (*)   ? All other components within normal limits  ?ETHANOL - Abnormal; Notable for the following components:  ? Alcohol, Ethyl (B) 422 (*)   ? All other components within normal limits  ?CBC WITH DIFFERENTIAL/PLATELET - Abnormal; Notable for the following components:  ? RBC 4.17 (*)   ? All other components within normal limits  ?RAPID URINE DRUG SCREEN, HOSP PERFORMED  ?CBG MONITORING, ED  ? ? ?EKG ?EKG Interpretation ? ?Date/Time:  Tuesday July 23 2021 19:56:49 EST ?Ventricular Rate:  78 ?PR  Interval:  200 ?QRS Duration: 88 ?QT Interval:  371 ?QTC Calculation: 423 ?R Axis:   79 ?Text Interpretation: Sinus rhythm Probable left ventricular hypertrophy ST elevation suggests acute pericarditis No significant change since last tracing Confirmed by Dorie Rank [id number]) on 07/23/2021 8:17:12 PM ? ?Radiology ?CT HEAD WO CONTRAST ? ?Result Date: 07/23/2021 ?CLINICAL DATA:  Patient found down. EXAM: CT HEAD WITHOUT CONTRAST TECHNIQUE: Contiguous axial images were obtained from the base of the skull through the vertex without intravenous contrast. RADIATION DOSE REDUCTION: This exam was performed according to the departmental dose-optimization program which includes automated exposure control, adjustment of the mA and/or kV according to patient size and/or use of iterative reconstruction technique. COMPARISON:  March 20, 2017 FINDINGS: Brain: No evidence of acute infarction, hemorrhage, hydrocephalus, extra-axial collection or mass lesion/mass effect. Vascular: No hyperdense vessel or unexpected calcification. Skull: Normal. Negative for fracture or focal lesion. Sinuses/Orbits: There is mild anterior right ethmoid sinus mucosal thickening. Other: None. IMPRESSION: No acute intracranial abnormality. Electronically Signed   By: Virgina Norfolk M.D.   On: 07/23/2021 20:50  ? ?DG Chest Portable 1 View ? ?Result Date: 07/23/2021 ?CLINICAL DATA:  Unresponsive with breathing difficulty, initial encounter EXAM: PORTABLE CHEST 1 VIEW COMPARISON:  03/20/2017 FINDINGS: The heart size and mediastinal contours are within normal limits. Both lungs are clear. The visualized skeletal structures are unremarkable. IMPRESSION: No active disease. Electronically Signed   By: Inez Catalina M.D.   On: 07/23/2021 02:05   ? ?Procedures ?Procedures  ? ? ?Medications Ordered in ED ?Medications  ?sodium chloride 0.9 % bolus 1,000 mL (1,000 mLs Intravenous New Bag/Given 07/23/21 2047)  ?  And  ?0.9 %  sodium chloride infusion (has no administration  in time range)  ?naloxone [facility]) injection 1 mg (1 mg Intravenous Given 07/23/21 2047)  ?ondansetron [facility]) injection 4 mg (4 mg Intravenous Given 07/23/21 2124)  ? ? ?ED Course/ Medical Decision Making/ A&P ?Clinical Course as of 07/23/21 2209  ?Tue Jul 23, 2021  ?2059 No response to second narcan ? [JK]  ?2059 Ethanol(!!) ?ETOh severely elevated [JK]  ?2059 CBC WITH DIFFERENTIAL(!) ?Normal [JK]  ?2100 Comprehensive metabolic panel(!) ?Normal [JK]  ?2100 CT HEAD WO CONTRAST ?Head CT images and radiology report reviewed.  No acute findings [JK]  ?  ?Clinical Course User Index ?[JK] Dorie Rank, MD  ? ?                        ?Medical Decision Making ?Amount and/or Complexity of Data Reviewed ?Labs: ordered. Decision-making details documented in ED Course. ?Radiology: ordered. Decision-making details documented  in ED Course. ? ?Risk ?Prescription drug management. ? ? ?Pt presents with altered mental status.  No response to narcan.  Alcohol level significantly elevated.   IV antiemetics, iv fluids and thiamine iv ordered.  Will need to monitor closely until pt is clinically sober as he is at risk for aspiration. ?Care will be signed out to oncoming team ? ? ? ? ? ? ?Final Clinical Impression(s) / ED Diagnoses ?Final diagnoses:  ?Alcoholic intoxication with complication ([facility])  ? ? ? ? ?  ?Dorie Rank, MD ?07/23/21 2211 ? ?

## 2021-07-23 NOTE — ED Triage Notes (Signed)
Pt BIB EMS with alcohol intoxication. Pt was found laying down outside of a restaurant. ? ?BP 118 /60 ?HR 86 ?RR 16 ?O2sat 99% on RA ?CBG 106 ?

## 2021-07-23 NOTE — ED Notes (Signed)
Patient found out of bed, attempting to pull pants down.  Patient stating that he needed a urinal.  Patient very unsteady with gait and given a urinal. Patient then proceeded to urinate on the wall and floor.  This RN stayed with patient to prevent patient from falling.  Patient assisted back into bed when he was finished urinating and given warm blankets.  ?

## 2021-07-23 NOTE — Discharge Instructions (Addendum)
You came to the emergency department today to be evaluated for reports of breathing difficulties and concern for pneumonia.  Your chest x-ray, and physical exam were reassuring.  You will need to follow-up with the [facility] in the outpatient setting to have your lab result rechecked.   ? ?Get help right away if: ?You have any of the following: ?Moderate to severe trouble with coordination, speech, memory, or attention. ?Trouble staying awake. ?Severe confusion. ?A seizure. ?Light-headedness. ?Fainting. ?Vomiting bright red blood or material that looks like coffee grounds. ?Bloody stool (feces). The blood may make your stool bright red, black, or tarry. It may also smell bad. ?Shakiness when trying to stop drinking. ?Thoughts about hurting yourself or others. ?

## 2021-07-23 NOTE — ED Notes (Signed)
Patient turned himself on his left side and curled up.  Patient respirations even and unlabored.  ?

## 2021-07-23 NOTE — ED Notes (Signed)
Patient sleeping, snoring softly.  Patient awakes to stimuli and falls back asleep.  Respirations even and unlabored, in NAD at this time.  ?

## 2021-07-24 ENCOUNTER — Emergency Department (HOSPITAL_COMMUNITY)
Admission: EM | Admit: 2021-07-24 | Discharge: 2021-07-25 | Disposition: A | Discharge status: Home / Self Care | Payer: 59 | Attending: Emergency Medicine | Admitting: Emergency Medicine

## 2021-07-24 ENCOUNTER — Other Ambulatory Visit: Payer: Self-pay

## 2021-07-24 ENCOUNTER — Encounter (HOSPITAL_COMMUNITY): Payer: Self-pay

## 2021-07-24 DIAGNOSIS — F10920 Alcohol use, unspecified with intoxication, uncomplicated: Secondary | ICD-10-CM | POA: Insufficient documentation

## 2021-07-24 DIAGNOSIS — Y907 Blood alcohol level of 200-239 mg/100 ml: Secondary | ICD-10-CM | POA: Insufficient documentation

## 2021-07-24 DIAGNOSIS — R519 Headache, unspecified: Secondary | ICD-10-CM | POA: Diagnosis present

## 2021-07-24 NOTE — ED Triage Notes (Signed)
Pt BIB EMS from [place], [place], reports of headache. Pt is currently sleeping.  ? ?130/78 ?90 hr ?18 r ?98% room air  ?98.2 F  ?

## 2021-07-24 NOTE — ED Provider Notes (Signed)
?  Physical Exam  ?BP 105/70   Pulse 88   Temp 98 ?F (36.7 ?C) (Oral)   Resp 16   SpO2 93%  ? ?Physical Exam ?Vitals and nursing note reviewed.  ?Constitutional:   ?   General: He is not in acute distress. ?   Appearance: He is well-developed. He is not diaphoretic.  ?HENT:  ?   Head: Normocephalic and atraumatic.  ?Cardiovascular:  ?   Heart sounds: No murmur heard. ?  No friction rub.  ?Musculoskeletal:     ?   General: Normal range of motion.  ?   Cervical back: Normal range of motion and neck supple.  ?Skin: ?   General: Skin is warm and dry.  ?Neurological:  ?   General: No focal deficit present.  ?   Mental Status: He is alert and oriented to person, place, and time.  ?   Coordination: Coordination normal.  ? ? ?Procedures  ?Procedures ? ?ED Course / MDM  ? ?Clinical Course as of 07/24/21 0520  ?Tue Jul 23, 2021  ?2059 No response to second narcan ? [JK]  ?2059 Ethanol(!!) ?ETOh severely elevated [JK]  ?2059 CBC WITH DIFFERENTIAL(!) ?Normal [JK]  ?2100 Comprehensive metabolic panel(!) ?Normal [JK]  ?2100 CT HEAD WO CONTRAST ?Head CT images and radiology report reviewed.  No acute findings [JK]  ?  ?Clinical Course User Index ?[JK] Dorie Rank, MD  ? ?Care assumed from Dr. Tomi Bamberger at shift change.  Patient presenting here intoxicated with markedly elevated blood alcohol.  Patient arrived here difficult to arouse.  He has been allowed to remain in the emergency department and has since sobered up.  He is now awake, alert, ambulating in the department and eating and drinking.  Discharge seems appropriate. ? ? ? ? ?  ?Veryl Speak, MD ?07/24/21 [phone] ? ?

## 2021-07-24 NOTE — ED Notes (Signed)
Patient unable to sign MSE d/t being clinically intoxicated.  ?

## 2021-07-24 NOTE — Discharge Instructions (Signed)
Follow up with your primary doctor °

## 2021-07-24 NOTE — ED Notes (Signed)
Patient remains sleeping, softly snoring.  Respirations even and unlabored, patient awakes to stimuli and then falls back asleep.  ?

## 2021-07-24 NOTE — ED Notes (Signed)
Patient awake, requesting a shower and sandwich.  Patient given a sandwich.  ?

## 2021-07-25 LAB — COMPREHENSIVE METABOLIC PANEL
ALT: 30 U/L (ref 0–44)
AST: 42 U/L — ABNORMAL HIGH (ref 15–41)
Albumin: 3.7 g/dL (ref 3.5–5.0)
Alkaline Phosphatase: 73 U/L (ref 38–126)
Anion gap: 7 (ref 5–15)
BUN: 18 mg/dL (ref 6–20)
CO2: 27 mmol/L (ref 22–32)
Calcium: 8.9 mg/dL (ref 8.9–10.3)
Chloride: 103 mmol/L (ref 98–111)
Creatinine, Ser: 0.83 mg/dL (ref 0.61–1.24)
GFR, Estimated: >60 mL/min (ref >60)
Glucose, Bld: 114 mg/dL — ABNORMAL HIGH (ref 70–99)
Potassium: 3.5 mmol/L (ref 3.5–5.1)
Sodium: 137 mmol/L (ref 135–145)
Total Bilirubin: 0.1 mg/dL — ABNORMAL LOW (ref 0.3–1.2)
Total Protein: 6.6 g/dL (ref 6.5–8.1)

## 2021-07-25 LAB — CBC WITH DIFFERENTIAL/PLATELET
Abs Immature Granulocytes: 0 10*3/uL (ref 0.00–0.07)
Basophils Absolute: 0 10*3/uL (ref 0.0–0.1)
Basophils Relative: 1 %
Eosinophils Absolute: 0.1 10*3/uL (ref 0.0–0.5)
Eosinophils Relative: 2 %
HCT: 37.3 % — ABNORMAL LOW (ref 39.0–52.0)
Hemoglobin: 12.6 g/dL — ABNORMAL LOW (ref 13.0–17.0)
Immature Granulocytes: 0 %
Lymphocytes Relative: 43 %
Lymphs Abs: 2.1 10*3/uL (ref 0.7–4.0)
MCH: 32.4 pg (ref 26.0–34.0)
MCHC: 33.8 g/dL (ref 30.0–36.0)
MCV: 95.9 fL (ref 80.0–100.0)
Monocytes Absolute: 0.5 10*3/uL (ref 0.1–1.0)
Monocytes Relative: 9 %
Neutro Abs: 2.2 10*3/uL (ref 1.7–7.7)
Neutrophils Relative %: 45 %
Platelets: 272 10*3/uL (ref 150–400)
RBC: 3.89 MIL/uL — ABNORMAL LOW (ref 4.22–5.81)
RDW: 15.3 % (ref 11.5–15.5)
WBC: 4.9 10*3/uL (ref 4.0–10.5)
nRBC: 0 % (ref 0.0–0.2)

## 2021-07-25 LAB — ETHANOL: Alcohol, Ethyl (B): 231 mg/dL — ABNORMAL HIGH (ref <10)

## 2021-07-25 NOTE — Discharge Instructions (Addendum)
Please read and follow all provided instructions. ? ?Your diagnoses today include:  ?1. Alcoholic intoxication without complication ([place])   ? ? ?Tests performed today include: ?Complete blood cell count:  ?Basic metabolic panel:  ?Alcohol level: was high ?Vital signs. See below for your results today.  ? ?Medications prescribed:  ?None ? ?Take any prescribed medications only as directed. ? ?Home care instructions:  ?Follow any educational materials contained in this packet. ? ?BE VERY CAREFUL not to take multiple medicines containing Tylenol (also called acetaminophen). Doing so can lead to an overdose which can damage your liver and cause liver failure and possibly death.  ? ?Follow-up instructions: ?Please follow-up with your primary care provider in the next 3 days for further evaluation of your symptoms.  ? ?Return instructions:  ?Please return to the Emergency Department if you experience worsening symptoms.  ?Please return if you have any other emergent concerns. ? ?Additional Information: ? ?Your vital signs today were: ?BP 134/86 (BP Location: Right Arm)   Pulse 92   Temp 97.6 ?F (36.4 ?C) (Oral)   Resp 16   Ht '5\' 5"'$  (1.651 m)   Wt 68 kg   SpO2 98%   BMI 24.96 kg/m?  ?If your blood pressure (BP) was elevated above 135/85 this visit, please have this repeated by your doctor within one month. ?-------------- ? ?

## 2021-07-25 NOTE — ED Notes (Addendum)
PT ambulated steady and was able to tolerate food and drink. ? ?

## 2021-07-25 NOTE — ED Provider Notes (Signed)
?[facility] DEPT ?Provider Note ? ? ?CSN: [id number] ?Arrival date & time: 07/24/21  2344 ? ?  ? ?History ? ?Chief Complaint  ?Patient presents with  ? Alcohol Intoxication  ? ? ?David Burnett is a 31 y.o. male. ? ?Patient with history of alcohol abuse presents to the emergency department by EMS from [place].  Patient was recently discharged from the emergency department early yesterday morning.  Patient currently sleeping and difficult to arouse.  Per EMS, reported headache.  No obvious signs of trauma or reported trauma. ? ? ?  ? ?Home Medications ?Prior to Admission medications   ?Medication Sig Start Date End Date Taking? Authorizing Provider  ?Multiple Vitamin (MULTIVITAMIN WITH MINERALS) TABS tablet Take 1 tablet by mouth daily. 03/21/17   Bonnielee Haff, MD  ?ranitidine (ZANTAC) 150 MG capsule Take 1 capsule (150 mg total) by mouth 2 (two) times daily. 03/21/17   Bonnielee Haff, MD  ?   ? ?Allergies    ?Patient has no known allergies.   ? ?Review of Systems   ?Review of Systems ? ?Physical Exam ?Updated Vital Signs ?BP 134/86 (BP Location: Right Arm)   Pulse 92   Temp 97.6 ?F (36.4 ?C) (Oral)   Resp 16   Ht '5\' 5"'$  (1.651 m)   Wt 68 kg   SpO2 98%   BMI 24.96 kg/m?  ? ?Physical Exam ?Vitals and nursing note reviewed.  ?Constitutional:   ?   General: He is not in acute distress. ?   Appearance: He is well-developed.  ?HENT:  ?   Head: Normocephalic and atraumatic.  ?   Comments: No signs of head trauma ?Eyes:  ?   General:     ?   Right eye: No discharge.     ?   Left eye: No discharge.  ?   Conjunctiva/sclera: Conjunctivae normal.  ?   Comments: Pupils round and reactive  ?Cardiovascular:  ?   Rate and Rhythm: Normal rate and regular rhythm.  ?   Heart sounds: Normal heart sounds.  ?Pulmonary:  ?   Effort: Pulmonary effort is normal.  ?   Breath sounds: Normal breath sounds.  ?Abdominal:  ?   Palpations: Abdomen is soft.  ?   Comments: Abdomen is soft  ?Musculoskeletal:  ?    Cervical back: Normal range of motion and neck supple.  ?   Comments: No obvious signs of extremity trauma.  ?Skin: ?   General: Skin is warm and dry.  ?Neurological:  ?   Mental Status: He is alert.  ?   Comments: Patient sleeping.  ? ? ?ED Results / Procedures / Treatments   ?Labs ?(all labs ordered are listed, but only abnormal results are displayed) ?Labs Reviewed  ?CBC WITH DIFFERENTIAL/PLATELET - Abnormal; Notable for the following components:  ?    Result Value  ? RBC 3.89 (*)   ? Hemoglobin 12.6 (*)   ? HCT 37.3 (*)   ? All other components within normal limits  ?COMPREHENSIVE METABOLIC PANEL - Abnormal; Notable for the following components:  ? Glucose, Bld 114 (*)   ? AST 42 (*)   ? Total Bilirubin 0.1 (*)   ? All other components within normal limits  ?ETHANOL - Abnormal; Notable for the following components:  ? Alcohol, Ethyl (B) 231 (*)   ? All other components within normal limits  ? ? ?EKG ?None ? ?Radiology ?CT HEAD WO CONTRAST ? ?Result Date: 07/23/2021 ?CLINICAL DATA:  Patient found down. EXAM:  CT HEAD WITHOUT CONTRAST TECHNIQUE: Contiguous axial images were obtained from the base of the skull through the vertex without intravenous contrast. RADIATION DOSE REDUCTION: This exam was performed according to the departmental dose-optimization program which includes automated exposure control, adjustment of the mA and/or kV according to patient size and/or use of iterative reconstruction technique. COMPARISON:  March 20, 2017 FINDINGS: Brain: No evidence of acute infarction, hemorrhage, hydrocephalus, extra-axial collection or mass lesion/mass effect. Vascular: No hyperdense vessel or unexpected calcification. Skull: Normal. Negative for fracture or focal lesion. Sinuses/Orbits: There is mild anterior right ethmoid sinus mucosal thickening. Other: None. IMPRESSION: No acute intracranial abnormality. Electronically Signed   By: Virgina Norfolk M.D.   On: 07/23/2021 20:50   ? ?Procedures ?Procedures   ? ? ?Medications Ordered in ED ?Medications - No data to display ? ?ED Course/ Medical Decision Making/ A&P ?  ? ?Patient seen and examined shortly after arrival.  Patient appears clinically intoxicated.  He is sleeping and difficult to arouse. ? ?Labs/EKG: Ordered CBC, CMP, EtOH due to obtundation. ? ?Imaging: Patient has had a head CT within the past 36 hours.  No obvious signs of trauma on exam currently.  Considered repeat CT, but will monitor for sobriety and order as needed. ? ?Medications/Fluids: None ordered ? ?Most recent vital signs reviewed and are as follows: ?BP 134/86 (BP Location: Right Arm)   Pulse 92   Temp 97.6 ?F (36.4 ?C) (Oral)   Resp 16   Ht '5\' 5"'$  (1.651 m)   Wt 68 kg   SpO2 98%   BMI 24.96 kg/m?  ? ?Initial impression: Alcohol intoxication ? ?3:38 AM patient rechecked, continues to sleep. ? ?5:36 AM Reassessment performed. Patient awake and alert now, conversant.  He recounts feeling poorly last evening, having difficulty sleeping.  He did vomit.  Admits to alcohol use.  States that he had a headache earlier, now resolved.  He is asking for a sandwich and states that he feels comfortable being discharged. ? ?Reviewed pertinent lab work and imaging with patient at bedside. Questions answered.  ? ?Most current vital signs reviewed and are as follows: ?BP 134/86 (BP Location: Right Arm)   Pulse 92   Temp 97.6 ?F (36.4 ?C) (Oral)   Resp 16   Ht '5\' 5"'$  (1.651 m)   Wt 68 kg   SpO2 98%   BMI 24.96 kg/m?  ? ?Plan: Discharge to home if he can ambulate without any difficulties. ? ?No prescriptions written. ? ?Other home care instructions discussed: Follow-up with PCP discussed avoidance of alcohol. ? ?ED return instructions discussed: Persistent vomiting, severe headache. ? ?Follow-up instructions discussed: Patient encouraged to follow-up with their PCP in 3 days.  ? ? ? ?                        ?Medical Decision Making ?Amount and/or Complexity of Data Reviewed ?Labs:  ordered. ? ? ?Patient here with altered mental status in setting of alcohol use.  He was monitored for several hours and now appears clinically sober.  Exam is normal without any signs of trauma.  Labs tonight consistent with alcohol use, otherwise no acute findings.  No concern for stroke or other intracranial etiology.  Low concern for CNS infection. ? ? ? ? ? ? ? ? ? ?Final Clinical Impression(s) / ED Diagnoses ?Final diagnoses:  ?Alcoholic intoxication without complication ([place])  ? ? ?Rx / DC Orders ?ED Discharge Orders   ? ? None  ? ?  ? ? ?  ?  Carlisle Cater, PA-C ?07/25/21 9290 ? ?  ?Fatima Blank, MD ?07/25/21 [phone] ? ?

## 2021-08-01 ENCOUNTER — Other Ambulatory Visit: Payer: Self-pay

## 2021-08-01 ENCOUNTER — Emergency Department (HOSPITAL_COMMUNITY)
Admission: EM | Admit: 2021-08-01 | Discharge: 2021-08-02 | Disposition: A | Discharge status: Home / Self Care | Payer: 59 | Attending: Emergency Medicine | Admitting: Emergency Medicine

## 2021-08-01 DIAGNOSIS — F10120 Alcohol abuse with intoxication, uncomplicated: Secondary | ICD-10-CM | POA: Insufficient documentation

## 2021-08-01 DIAGNOSIS — E876 Hypokalemia: Secondary | ICD-10-CM | POA: Insufficient documentation

## 2021-08-01 DIAGNOSIS — F10920 Alcohol use, unspecified with intoxication, uncomplicated: Secondary | ICD-10-CM

## 2021-08-01 DIAGNOSIS — Y908 Blood alcohol level of 240 mg/100 ml or more: Secondary | ICD-10-CM | POA: Diagnosis not present

## 2021-08-01 MED ORDER — NALOXONE HCL 2 MG/2ML IJ SOSY
2.0000 mg | PREFILLED_SYRINGE | Freq: Once | INTRAMUSCULAR | Status: AC
Start: 2021-08-01 — End: 2021-08-01
  Administered 2021-08-01: 2 mg via INTRAVENOUS
  Filled 2021-08-01: qty 2

## 2021-08-01 NOTE — ED Provider Notes (Signed)
?[facility] ?Provider Note ? ? ?CSN: [id number] ?Arrival date & time: 08/01/21  2325 ? ?  ? ?History ? ?Chief Complaint  ?Patient presents with  ? unresponsive  ? ? ?David Burnett is a 31 y.o. male. ? ?The history is provided by the EMS personnel. The history is limited by the condition of the patient (Patient unresponsive).  ?He was found unresponsive on a bus.  Apparently, the bus driver was told to finish the route and when the route was completed, patient was still unresponsive.  EMS reported slow respirations and gave him 1 respiration via bag-valve-mask and he seemed to improve his breathing. ?  ?Home Medications ?Prior to Admission medications   ?Not on File  ?   ? ?Allergies    ?Patient has no allergy information on record.   ? ?Review of Systems   ?Review of Systems  ?Unable to perform ROS: Patient unresponsive  ? ?Physical Exam ?Updated Vital Signs ?BP 106/60   Pulse 71   Temp 97.9 ?F (36.6 ?C) (Temporal)   Resp 12   SpO2 97%  ?Physical Exam ?Vitals and nursing note reviewed.  ?31 year old male, resting comfortably and in no acute distress. Vital signs are normal. Oxygen saturation is 97%, which is normal. ?Head is normocephalic and atraumatic.  Pupils are pinpoint.  Corneal reflexes present. ?Neck is supple. ?Lungs are clear without rales, wheezes, or rhonchi. ?Chest moves symmetrically. ?Heart has regular rate and rhythm without murmur. ?Abdomen is soft, flat. ?Extremities have no cyanosis or edema, full range of motion is present. ?Skin is warm and dry without rash. ?Neurologic: Patient unresponsive, minimal response to pain but corneal reflexes and gag reflex is intact.  No spontaneous movement. ? ?ED Results / Procedures / Treatments   ?Labs ?(all labs ordered are listed, but only abnormal results are displayed) ?Labs Reviewed  ?COMPREHENSIVE METABOLIC PANEL  ?ETHANOL  ?CBC WITH DIFFERENTIAL/PLATELET  ?RAPID URINE DRUG SCREEN, HOSP PERFORMED  ?AMMONIA   ? ? ?EKG ?None ? ?Radiology ?No results found. ? ?Procedures ?Procedures  ?Cardiac monitor shows normal sinus rhythm per my interpretation. ? ?Medications Ordered in ED ?Medications  ?potassium chloride SA (KLOR-CON M) CR tablet 40 mEq (has no administration in time range)  ?naloxone [facility]) injection 2 mg (2 mg Intravenous Given 08/01/21 2346)  ?naloxone [facility]) injection 2 mg (2 mg Intravenous Given 08/02/21 0013)  ? ? ?ED Course/ Medical Decision Making/ A&P ?  ?                        ?Medical Decision Making ?Amount and/or Complexity of Data Reviewed ?Labs: ordered. ? ?Risk ?Prescription drug management. ? ? ?Patient unresponsive.  Pinpoint pupils are suggestive of opioid overdose, he will be given a dose of naloxone.  Consider other intoxicants such as ethanol.  No focal findings to suggest stroke.  We will check screening labs including ethanol and ammonia level. ? ?There was partial response to naloxone, naloxone will be repeated. ? ?There was little response to additional naloxone.  Labs have come back significant for significantly elevated ethanol level of 291.  I believe this is the cause of his decreased responsiveness.  There is no evidence of head injury, I do not feel CT of the head is indicated at this point.  He is also noted to be hypokalemic, will need to defer potassium supplementation until he is more awake.  He will need to be observed for improvement in mental  status as he metabolizes the ethanol. ? ?Patient reevaluated, is now awake and alert and conversant.  He states that he did consume some alcohol last night and thinks that he would have been okay if he was able to get something to drink.  He he is given a dose of oral potassium and is discharged with prescription for K-dur.  Given outpatient resources for substance abuse. ? ?CRITICAL CARE ?Performed by: Delora Fuel ?Total critical care time: 40 minutes ?Critical care time was exclusive of separately billable procedures and treating  other patients. ?Critical care was necessary to treat or prevent imminent or life-threatening deterioration. ?Critical care was time spent personally by me on the following activities: development of treatment plan with patient and/or surrogate as well as nursing, discussions with consultants, evaluation of patient's response to treatment, examination of patient, obtaining history from patient or surrogate, ordering and performing treatments and interventions, ordering and review of laboratory studies, ordering and review of radiographic studies, pulse oximetry and re-evaluation of patient's condition. ?  ? ? ? ? ?Final Clinical Impression(s) / ED Diagnoses ?Final diagnoses:  ?Alcohol intoxication, uncomplicated ([place])  ?Hypokalemia  ? ? ?Rx / DC Orders ?ED Discharge Orders   ? ?      Ordered  ?  potassium chloride SA (KLOR-CON M) 20 MEQ tablet  2 times daily,   Status:  Discontinued       ? 08/02/21 0657  ?  potassium chloride SA (KLOR-CON M) 20 MEQ tablet  2 times daily       ? 08/02/21 0657  ? ?  ?  ? ?  ? ? ?  ?Delora Fuel, MD ?54/09/81 0700 ? ?

## 2021-08-01 NOTE — ED Triage Notes (Signed)
Pt here via [facility] after being found unresponsive on a bus. Per bus driver, he alerted his headquarters who told him to finish the route. When bus driver finished his route, pt was still unresponsive. Upon Ems arrival pt had very slow respirations, gave him 1 assisted respiration via BVM and pt began spontaneously breathing. Pt is responsive to pain. 18g LAC, 14RR, 104/76, pupils are pinpoint 83m. ?

## 2021-08-02 ENCOUNTER — Other Ambulatory Visit (HOSPITAL_COMMUNITY): Payer: Self-pay

## 2021-08-02 LAB — COMPREHENSIVE METABOLIC PANEL
ALT: 25 U/L (ref 0–44)
AST: 33 U/L (ref 15–41)
Albumin: 3.9 g/dL (ref 3.5–5.0)
Alkaline Phosphatase: 60 U/L (ref 38–126)
Anion gap: 10 (ref 5–15)
BUN: 14 mg/dL (ref 6–20)
CO2: 27 mmol/L (ref 22–32)
Calcium: 9.2 mg/dL (ref 8.9–10.3)
Chloride: 103 mmol/L (ref 98–111)
Creatinine, Ser: 1.14 mg/dL (ref 0.61–1.24)
GFR, Estimated: >60 mL/min (ref >60)
Glucose, Bld: 104 mg/dL — ABNORMAL HIGH (ref 70–99)
Potassium: 3 mmol/L — ABNORMAL LOW (ref 3.5–5.1)
Sodium: 140 mmol/L (ref 135–145)
Total Bilirubin: 0.5 mg/dL (ref 0.3–1.2)
Total Protein: 6.4 g/dL — ABNORMAL LOW (ref 6.5–8.1)

## 2021-08-02 LAB — CBC WITH DIFFERENTIAL/PLATELET
Abs Immature Granulocytes: 0.02 10*3/uL (ref 0.00–0.07)
Basophils Absolute: 0.1 10*3/uL (ref 0.0–0.1)
Basophils Relative: 1 %
Eosinophils Absolute: 0.2 10*3/uL (ref 0.0–0.5)
Eosinophils Relative: 5 %
HCT: 39.6 % (ref 39.0–52.0)
Hemoglobin: 13.5 g/dL (ref 13.0–17.0)
Immature Granulocytes: 0 %
Lymphocytes Relative: 52 %
Lymphs Abs: 2.3 10*3/uL (ref 0.7–4.0)
MCH: 33.3 pg (ref 26.0–34.0)
MCHC: 34.1 g/dL (ref 30.0–36.0)
MCV: 97.5 fL (ref 80.0–100.0)
Monocytes Absolute: 0.6 10*3/uL (ref 0.1–1.0)
Monocytes Relative: 13 %
Neutro Abs: 1.3 10*3/uL — ABNORMAL LOW (ref 1.7–7.7)
Neutrophils Relative %: 29 %
Platelets: 301 10*3/uL (ref 150–400)
RBC: 4.06 MIL/uL — ABNORMAL LOW (ref 4.22–5.81)
RDW: 14.6 % (ref 11.5–15.5)
WBC: 4.5 10*3/uL (ref 4.0–10.5)
nRBC: 0 % (ref 0.0–0.2)

## 2021-08-02 LAB — ETHANOL: Alcohol, Ethyl (B): 291 mg/dL — ABNORMAL HIGH (ref <10)

## 2021-08-02 LAB — AMMONIA: Ammonia: 26 umol/L (ref 9–35)

## 2021-08-02 MED ORDER — POTASSIUM CHLORIDE CRYS ER 20 MEQ PO TBCR
40.0000 meq | EXTENDED_RELEASE_TABLET | Freq: Once | ORAL | Status: AC
  Administered 2021-08-02: 40 meq via ORAL
  Filled 2021-08-02: qty 2

## 2021-08-02 MED ORDER — POTASSIUM CHLORIDE CRYS ER 20 MEQ PO TBCR
20.0000 meq | EXTENDED_RELEASE_TABLET | Freq: Two times a day (BID) | ORAL | 0 refills | Status: DC
  Filled 2021-08-02: qty 20, 10d supply, fill #0

## 2021-08-02 MED ORDER — POTASSIUM CHLORIDE CRYS ER 20 MEQ PO TBCR: 20.0000 meq | EXTENDED_RELEASE_TABLET | Freq: Two times a day (BID) | ORAL | 0 refills | Status: DC

## 2021-08-02 MED ORDER — NALOXONE HCL 2 MG/2ML IJ SOSY
2.0000 mg | PREFILLED_SYRINGE | Freq: Once | INTRAMUSCULAR | Status: AC
  Administered 2021-08-02: 2 mg via INTRAVENOUS
  Filled 2021-08-02: qty 2

## 2021-08-03 ENCOUNTER — Other Ambulatory Visit: Payer: Self-pay

## 2021-08-03 ENCOUNTER — Emergency Department (HOSPITAL_COMMUNITY)
Admission: EM | Admit: 2021-08-03 | Discharge: 2021-08-04 | Disposition: A | Discharge status: Home / Self Care | Payer: 59 | Attending: Emergency Medicine | Admitting: Emergency Medicine

## 2021-08-03 ENCOUNTER — Encounter (HOSPITAL_COMMUNITY): Payer: Self-pay

## 2021-08-03 DIAGNOSIS — Y908 Blood alcohol level of 240 mg/100 ml or more: Secondary | ICD-10-CM | POA: Diagnosis not present

## 2021-08-03 DIAGNOSIS — F10920 Alcohol use, unspecified with intoxication, uncomplicated: Secondary | ICD-10-CM

## 2021-08-03 DIAGNOSIS — Z79899 Other long term (current) drug therapy: Secondary | ICD-10-CM | POA: Diagnosis not present

## 2021-08-03 DIAGNOSIS — F10129 Alcohol abuse with intoxication, unspecified: Secondary | ICD-10-CM | POA: Diagnosis present

## 2021-08-03 NOTE — ED Triage Notes (Addendum)
Pt to ED via [facility].  Pt was at [employer] and they were attempting to close but were unable to wake patient up at his table.  Pt was asleep when EMS arrived, they were able to rouse him with an ammonia inhalent and he gave his name and birthdate and admits to 5 drinks.  Pt sleeping on arrival, does not wake up with stimulation. Pt had an empty liquor bottle on his person at scene.   ? ?EMS VS: ?116/72 ?HR=82 ?100% RA ?Cbg=118 ?

## 2021-08-03 NOTE — ED Provider Notes (Signed)
?[facility] DEPT ?[facility] Emergency Department ?Provider Note ?MRN:  [id number]  ?Arrival date & time: 08/04/21    ? ?Chief Complaint   ?Alcohol Intoxication ?  ?History of Present Illness   ?David Burnett is a 31 y.o. year-old male presents to the ED with chief complaint of alcohol intoxication.  He has multiple visits for the same.  Was reportedly found head down on a table at a [employer] with a bottle of liquor.  Ammonia capsule was deployed and the patient was able to say his name and stated that he had been drinking all day.  Upon my exam, patient is asleep.  He responds to painful stimulus.  There are no evidence of injuries.  Based on his significant history of alcohol abuse, I suspect that he is simply intoxicated. ? ? ? ? ?Review of Systems  ?Pertinent review of systems noted in HPI.  ? ? ?Physical Exam  ? ?Vitals:  ? 08/04/21 0515 08/04/21 0530  ?BP: 108/61 108/72  ?Pulse: 78 73  ?Resp: 16 16  ?Temp:    ?SpO2: 99% 99%  ?  ?CONSTITUTIONAL: Nontoxic-appearing, NAD ?NEURO: Asleep/intoxicated ?EYES:  eyes equal and reactive ?ENT/NECK:  Supple, no stridor  ?CARDIO: Normal rate, regular rhythm, appears well-perfused  ?PULM:  No respiratory distress, clear to auscultation ?GI/GU:  non-distended,  ?MSK/SPINE:  No gross deformities, no edema, no obvious abnormality ?SKIN:  no rash, atraumatic ? ? ?*Additional and/or pertinent findings included in MDM below ? ?Diagnostic and Interventional Summary  ? ? ?Labs Reviewed  ?COMPREHENSIVE METABOLIC PANEL - Abnormal; Notable for the following components:  ?    Result Value  ? BUN 21 (*)   ? All other components within normal limits  ?SALICYLATE LEVEL - Abnormal; Notable for the following components:  ? Salicylate Lvl <8.3 (*)   ? All other components within normal limits  ?ACETAMINOPHEN LEVEL - Abnormal; Notable for the following components:  ? Acetaminophen (Tylenol), Serum <10 (*)   ? All other components within normal limits  ?ETHANOL - Abnormal; Notable for  the following components:  ? Alcohol, Ethyl (B) 256 (*)   ? All other components within normal limits  ?CBC WITH DIFFERENTIAL/PLATELET  ?RAPID URINE DRUG SCREEN, HOSP PERFORMED  ?CBG MONITORING, ED  ?  ?No orders to display  ?  ?Medications - No data to display  ? ?Procedures  /  Critical Care ?Procedures ? ?ED Course and Medical Decision Making  ?I have reviewed the triage vital signs, the nursing notes, and pertinent available records from the EMR. ? ?Complexity of Problems Addressed: ?Moderate Complexity: Acute illness with systemic symptoms, requiring diagnostic workup to rule out more severe disease. ?Comorbidities affecting this illness/injury include: ?History of alcohol abuse ?Social Determinants Affecting Care: ?Complexity of care is increased due to alcoholism. ? ? ?ED Course: ?After considering the following differential, EtOH intoxication, I ordered labs to assess for any other potential metabolic derangements. ?I personally interpreted the labs which are notable for EtOH of 256 . ? ?Clinical Course as of 08/04/21 0555  ?Sun Aug 04, 2021  ?0553 Patient reassessed, he is alert and oriented.  Requesting breakfast. [RB]  ?  ?Clinical Course User Index ?[RB] Montine Circle, PA-C  ? ? ?Consultants: ?No consultations were needed in caring for this patient. ? ?Treatment and Plan: ?We will allow patient to sober up and then plan for discharge. ? ?Emergency department workup does not suggest an emergent condition requiring admission or immediate intervention beyond  what has been performed at this  time. The patient is safe for discharge and has  been instructed to return immediately for worsening symptoms, change in  symptoms or any other concerns ? ? ? ?Final Clinical Impressions(s) / ED Diagnoses  ? ?  ICD-10-CM   ?1. Alcoholic intoxication without complication ([place])  H90.931   ?  ?  ?ED Discharge Orders   ? ? None  ? ?  ?  ? ? ?Discharge Instructions Discussed with and Provided to Patient:  ? ?Discharge  Instructions   ?None ?  ? ?  ?Montine Circle, PA-C ?08/04/21 1216 ? ?  ?Quintella Reichert, MD ?08/05/21 [phone] ? ?

## 2021-08-04 LAB — RAPID URINE DRUG SCREEN, HOSP PERFORMED
Amphetamines: NOT DETECTED
Barbiturates: NOT DETECTED
Benzodiazepines: NOT DETECTED
Cocaine: NOT DETECTED
Opiates: NOT DETECTED
Tetrahydrocannabinol: NOT DETECTED

## 2021-08-04 LAB — CBC WITH DIFFERENTIAL/PLATELET
Abs Immature Granulocytes: 0.02 10*3/uL (ref 0.00–0.07)
Basophils Absolute: 0.1 10*3/uL (ref 0.0–0.1)
Basophils Relative: 1 %
Eosinophils Absolute: 0 10*3/uL (ref 0.0–0.5)
Eosinophils Relative: 1 %
HCT: 41.9 % (ref 39.0–52.0)
Hemoglobin: 13.9 g/dL (ref 13.0–17.0)
Immature Granulocytes: 1 %
Lymphocytes Relative: 41 %
Lymphs Abs: 1.8 10*3/uL (ref 0.7–4.0)
MCH: 32.7 pg (ref 26.0–34.0)
MCHC: 33.2 g/dL (ref 30.0–36.0)
MCV: 98.6 fL (ref 80.0–100.0)
Monocytes Absolute: 0.5 10*3/uL (ref 0.1–1.0)
Monocytes Relative: 11 %
Neutro Abs: 1.9 10*3/uL (ref 1.7–7.7)
Neutrophils Relative %: 45 %
Platelets: 320 10*3/uL (ref 150–400)
RBC: 4.25 MIL/uL (ref 4.22–5.81)
RDW: 14.8 % (ref 11.5–15.5)
WBC: 4.3 10*3/uL (ref 4.0–10.5)
nRBC: 0 % (ref 0.0–0.2)

## 2021-08-04 LAB — COMPREHENSIVE METABOLIC PANEL
ALT: 29 U/L (ref 0–44)
AST: 38 U/L (ref 15–41)
Albumin: 4.4 g/dL (ref 3.5–5.0)
Alkaline Phosphatase: 65 U/L (ref 38–126)
Anion gap: 10 (ref 5–15)
BUN: 21 mg/dL — ABNORMAL HIGH (ref 6–20)
CO2: 27 mmol/L (ref 22–32)
Calcium: 9.5 mg/dL (ref 8.9–10.3)
Chloride: 104 mmol/L (ref 98–111)
Creatinine, Ser: 0.93 mg/dL (ref 0.61–1.24)
GFR, Estimated: >60 mL/min (ref >60)
Glucose, Bld: 91 mg/dL (ref 70–99)
Potassium: 3.8 mmol/L (ref 3.5–5.1)
Sodium: 141 mmol/L (ref 135–145)
Total Bilirubin: 0.3 mg/dL (ref 0.3–1.2)
Total Protein: 7.7 g/dL (ref 6.5–8.1)

## 2021-08-04 LAB — CBG MONITORING, ED: Glucose-Capillary: 89 mg/dL (ref 70–99)

## 2021-08-04 LAB — ACETAMINOPHEN LEVEL: Acetaminophen (Tylenol), Serum: <10 ug/mL — ABNORMAL LOW (ref 10–30)

## 2021-08-04 LAB — SALICYLATE LEVEL: Salicylate Lvl: <7 mg/dL — ABNORMAL LOW (ref 7.0–30.0)

## 2021-08-04 LAB — ETHANOL: Alcohol, Ethyl (B): 256 mg/dL — ABNORMAL HIGH (ref <10)

## 2021-08-04 MED ORDER — POTASSIUM CHLORIDE CRYS ER 20 MEQ PO TBCR: 20.0000 meq | EXTENDED_RELEASE_TABLET | Freq: Two times a day (BID) | ORAL | 0 refills | Status: DC

## 2021-08-06 ENCOUNTER — Other Ambulatory Visit: Payer: Self-pay

## 2021-08-06 ENCOUNTER — Emergency Department (HOSPITAL_COMMUNITY)
Admission: EM | Admit: 2021-08-06 | Discharge: 2021-08-06 | Disposition: A | Discharge status: Home / Self Care | Payer: 59 | Attending: Emergency Medicine | Admitting: Emergency Medicine

## 2021-08-06 DIAGNOSIS — F10129 Alcohol abuse with intoxication, unspecified: Secondary | ICD-10-CM | POA: Insufficient documentation

## 2021-08-06 DIAGNOSIS — R5383 Other fatigue: Secondary | ICD-10-CM | POA: Insufficient documentation

## 2021-08-06 DIAGNOSIS — X31XXXA Exposure to excessive natural cold, initial encounter: Secondary | ICD-10-CM | POA: Diagnosis not present

## 2021-08-06 DIAGNOSIS — Y907 Blood alcohol level of 200-239 mg/100 ml: Secondary | ICD-10-CM | POA: Insufficient documentation

## 2021-08-06 DIAGNOSIS — F10920 Alcohol use, unspecified with intoxication, uncomplicated: Secondary | ICD-10-CM

## 2021-08-06 LAB — ETHANOL: Alcohol, Ethyl (B): 226 mg/dL — ABNORMAL HIGH (ref <10)

## 2021-08-06 NOTE — ED Triage Notes (Signed)
Per EMS- patient is homeless. Patient was downtown going in and out of stores. Patient disoriented.  ?Patient admitted to having alcohol. Pupils pinpoint and very lethargic. Patient also exposed to cold temperatures. ?

## 2021-08-06 NOTE — ED Notes (Signed)
Pt standing at bedside using urinal

## 2021-08-06 NOTE — ED Provider Notes (Signed)
?[facility] DEPT ?Provider Note ? ? ?CSN: [id number] ?Arrival date & time: 08/06/21  0935 ? ?  ? ?History ? ?Chief Complaint  ?Patient presents with  ? Alcohol Intoxication  ? lethargic  ? Cold Exposure  ? ? ?David Burnett is a 31 y.o. male. ? ?Patient is a 31 year old male with a long history of alcohol intoxication and homelessness who is presenting today with EMS because they reported he was downtown going in and out of stores, disoriented and acting very lethargic with pinpoint pupils.  They also were concerned that he was being exposed to cold temperatures.  Patient reports that they told him he was not acting normal and he should get checked out.  He reports he has drank 1 or 2 beers this morning he stated [facility] last night and was warm throughout the night but reports that he is hungry and tired.  He denies using any opioids, injecting any drugs or smoking anything.  He denies any pain or other complaints at this time.  Similar history of the same. ? ?The history is provided by the patient and medical records.  ?Alcohol Intoxication ? ? ?  ? ?Home Medications ?Prior to Admission medications   ?Medication Sig Start Date End Date Taking? Authorizing Provider  ?Multiple Vitamin (MULTIVITAMIN WITH MINERALS) TABS tablet Take 1 tablet by mouth daily. 03/21/17   Bonnielee Haff, MD  ?potassium chloride SA (KLOR-CON M) 20 MEQ tablet Take 1 tablet (20 mEq total) by mouth 2 (two) times daily. 08/04/21   Daleen Bo, MD  ?ranitidine (ZANTAC) 150 MG capsule Take 1 capsule (150 mg total) by mouth 2 (two) times daily. 03/21/17   Bonnielee Haff, MD  ?   ? ?Allergies    ?Patient has no known allergies.   ? ?Review of Systems   ?Review of Systems ? ?Physical Exam ?Updated Vital Signs ?BP 113/76   Pulse 72   Temp (!) 97.5 ?F (36.4 ?C) (Oral)   Resp 14   Ht '5\' 5"'$  (1.651 m)   Wt 77.1 kg   SpO2 100%   BMI 28.29 kg/m?  ?Physical Exam ?Vitals and nursing note reviewed.  ?Constitutional:   ?    General: He is not in acute distress. ?   Appearance: He is well-developed.  ?   Comments: Patient is lethargic but he is still able to answer some questions and wake up to voice  ?HENT:  ?   Head: Normocephalic and atraumatic.  ?Eyes:  ?   Conjunctiva/sclera: Conjunctivae normal.  ?   Comments: Pupils are 2 mm bilaterally and sluggishly reactive  ?Cardiovascular:  ?   Rate and Rhythm: Normal rate and regular rhythm.  ?   Heart sounds: No murmur heard. ?Pulmonary:  ?   Effort: Pulmonary effort is normal. No respiratory distress.  ?   Breath sounds: Normal breath sounds. No wheezing or rales.  ?Abdominal:  ?   General: There is no distension.  ?   Palpations: Abdomen is soft.  ?   Tenderness: There is no abdominal tenderness. There is no guarding or rebound.  ?Musculoskeletal:     ?   General: No tenderness. Normal range of motion.  ?   Cervical back: Normal range of motion and neck supple.  ?   Comments: No evidence of trauma.  Patient is able to stand and move all extremities without difficulty.  No track marks present on his upper or lower extremities  ?Skin: ?   General: Skin is warm and  dry.  ?   Findings: No erythema or rash.  ?Neurological:  ?   Comments: He is oriented to person and place.  He is able to follow commands.  He is able to stand and walk but slightly unsteady gait.  ?Psychiatric:  ?   Comments: Patient appears intoxicated  ? ? ?ED Results / Procedures / Treatments   ?Labs ?(all labs ordered are listed, but only abnormal results are displayed) ?Labs Reviewed  ?ETHANOL - Abnormal; Notable for the following components:  ?    Result Value  ? Alcohol, Ethyl (B) 226 (*)   ? All other components within normal limits  ? ? ?EKG ?None ? ?Radiology ?No results found. ? ?Procedures ?Procedures  ? ? ?Medications Ordered in ED ?Medications - No data to display ? ?ED Course/ Medical Decision Making/ A&P ?  ?                        ?Medical Decision Making ?Amount and/or Complexity of Data Reviewed ?Independent  Historian: EMS ?Labs: ordered. Decision-making details documented in ED Course. ? ? ?Patient presenting today with signs most classic for alcohol intoxication.  He does admit to drinking this morning.  Patient reports he stayed at the [facility] last night and was warm.  Low suspicion for hypothermia at this time.  Temperature is 97.5.  His hands are slightly cool but the rest of his body feels warm.  His pupils are 2 mm and sluggishly reactive however he denies using opiate medications.  He is controlling his airway, answering questions.  We will continue to monitor and will check an alcohol level.  No evidence of trauma or reported trauma at this time. ? ?12:59 PM ?Patient's alcohol level today is 229 and most likely the cause of his change in mental status.  We will continue to observe and make sure that patient returns to his baseline but this time does not require any further testing unless there is prolonged symptoms that are concerning.  His vital signs of been normal.  Anticipate discharge.  Patient does have outpatient resources already. ? ?12:59 PM ?Patient has eating, is able to stand and walk and even use a urinal without difficulty.  Feel that patient is sober enough for discharge at this time. ? ? ? ? ? ? ? ?Final Clinical Impression(s) / ED Diagnoses ?Final diagnoses:  ?Alcoholic intoxication without complication ([place])  ? ? ?Rx / DC Orders ?ED Discharge Orders   ? ? None  ? ?  ? ? ?  ?Blanchie Dessert, MD ?08/06/21 1259 ? ?

## 2021-09-10 ENCOUNTER — Emergency Department (HOSPITAL_COMMUNITY)
Admission: EM | Admit: 2021-09-10 | Discharge: 2021-09-11 | Disposition: A | Discharge status: Home / Self Care | Payer: 59 | Attending: Emergency Medicine | Admitting: Emergency Medicine

## 2021-09-10 DIAGNOSIS — F10120 Alcohol abuse with intoxication, uncomplicated: Secondary | ICD-10-CM | POA: Insufficient documentation

## 2021-09-10 DIAGNOSIS — Y908 Blood alcohol level of 240 mg/100 ml or more: Secondary | ICD-10-CM | POA: Insufficient documentation

## 2021-09-10 DIAGNOSIS — F10920 Alcohol use, unspecified with intoxication, uncomplicated: Secondary | ICD-10-CM

## 2021-09-10 LAB — BASIC METABOLIC PANEL
Anion gap: 4 — ABNORMAL LOW (ref 5–15)
BUN: 12 mg/dL (ref 6–20)
CO2: 28 mmol/L (ref 22–32)
Calcium: 8.8 mg/dL — ABNORMAL LOW (ref 8.9–10.3)
Chloride: 108 mmol/L (ref 98–111)
Creatinine, Ser: 0.93 mg/dL (ref 0.61–1.24)
GFR, Estimated: >60 mL/min (ref >60)
Glucose, Bld: 103 mg/dL — ABNORMAL HIGH (ref 70–99)
Potassium: 3.7 mmol/L (ref 3.5–5.1)
Sodium: 140 mmol/L (ref 135–145)

## 2021-09-10 LAB — CBC WITH DIFFERENTIAL/PLATELET
Abs Immature Granulocytes: 0.25 10*3/uL — ABNORMAL HIGH (ref 0.00–0.07)
Basophils Absolute: 0.1 10*3/uL (ref 0.0–0.1)
Basophils Relative: 1 %
Eosinophils Absolute: 0.1 10*3/uL (ref 0.0–0.5)
Eosinophils Relative: 1 %
HCT: 38.6 % — ABNORMAL LOW (ref 39.0–52.0)
Hemoglobin: 13 g/dL (ref 13.0–17.0)
Immature Granulocytes: 3 %
Lymphocytes Relative: 27 %
Lymphs Abs: 2.2 10*3/uL (ref 0.7–4.0)
MCH: 32.7 pg (ref 26.0–34.0)
MCHC: 33.7 g/dL (ref 30.0–36.0)
MCV: 97 fL (ref 80.0–100.0)
Monocytes Absolute: 1.2 10*3/uL — ABNORMAL HIGH (ref 0.1–1.0)
Monocytes Relative: 15 %
Neutro Abs: 4.4 10*3/uL (ref 1.7–7.7)
Neutrophils Relative %: 53 %
Platelets: 346 10*3/uL (ref 150–400)
RBC: 3.98 MIL/uL — ABNORMAL LOW (ref 4.22–5.81)
RDW: 13.1 % (ref 11.5–15.5)
WBC: 8.2 10*3/uL (ref 4.0–10.5)
nRBC: 0 % (ref 0.0–0.2)

## 2021-09-10 LAB — ETHANOL: Alcohol, Ethyl (B): 287 mg/dL — ABNORMAL HIGH (ref <10)

## 2021-09-10 NOTE — ED Provider Notes (Signed)
I assumed care of patient at shift change from previous team, please see their note for full H&P. ?Patient is here for evaluation of suspected and intoxication of alcohol. ? ?Physical Exam  ?BP 110/68   Pulse 61   Temp 98.5 ?F (36.9 ?C)   Resp 16   SpO2 100%  ? ?Awake and alert., Normal gait.  Speech is not slurred.  Respirations are even and nonlabored.  He is in no obvious distress. ? ?Clinical Course as of 09/11/21 0300  ?Tue Sep 10, 2021  ?2141 Alcohol, Ethyl (B)(!): 287 [MV]  ?  ?Clinical Course User Index ?[MV] Eustaquio Maize, PA-C  ? ?Medical Decision Making ?Amount and/or Complexity of Data Reviewed ?Labs: ordered. Decision-making details documented in ED Course. ? ? ?Plan is to follow-up on patient's mental status, allow him time to metabolize. ?09/11/21 0010: I evaluated patient.  He awakens to touch.  He has slurred speech and asks me for breakfast.  When I informed him that it is midnight he then states "well let me sleep" and falls back asleep. ?Will continue to monitor. ? ?0210: I was informed by RN that patient had a bowel movement on himself while in the bed.  He was unable to get up and ambulate to the bathroom.  He is currently reportedly eating a sandwich while in the bathroom.  RN reports he is stable with ambulating. ?Will evaluate patient once he is out of the bathroom. ? ?9702: patient was re-evaluated, he is awake, asks to be allowed to sleep.  He has showered, eaten.  ? ?Patient was observed in the emergency room for over 8-1/2 hours.  During this time he was able to demonstrate that he can safely ambulate, and is able to maintain adequate p.o. intake, and allowed to metabolize. ? ?Patient remained stable, does not appear to have an emergent medical condition at this time.  ? ?Return precautions were discussed with patient who states their understanding.  At the time of discharge patient denied any unaddressed complaints or concerns.  Patient is agreeable for discharge home. ? ?Note:  Portions of this report may have been transcribed using voice recognition software. Every effort was made to ensure accuracy; however, inadvertent computerized transcription errors may be present ? ? ? ? ? ? ? ? ?  ?Lorin Glass, [place] ?09/11/21 0302 ? ?  ?Ripley Fraise, MD ?09/11/21 0345 ? ?

## 2021-09-10 NOTE — ED Provider Notes (Signed)
?[facility] ?Provider Note ? ? ?CSN: [id number] ?Arrival date & time: 09/10/21  1818 ? ?  ? ?History ? ?Chief Complaint  ?Patient presents with  ? Alcohol Intoxication  ? ?LEVEL 5 CAVEAT - ALCOHOL INTOXCATION ? ?Zymarion Favorite is a 31 y.o. male who presents to the ED today via EMS for alcohol intoxication. Per triage report pt found staggering in the street. When EMS arrived pt said he drank "too much." Pt arousable when shaken awake.  ? ?Pt able to tell me he is hungry. He falls back asleep however arousable.  ? ?The history is provided by the patient, the EMS personnel and medical records.  ? ?  ? ?Home Medications ?Prior to Admission medications   ?Medication Sig Start Date End Date Taking? Authorizing Provider  ?Multiple Vitamin (MULTIVITAMIN WITH MINERALS) TABS tablet Take 1 tablet by mouth daily. 03/21/17   Bonnielee Haff, MD  ?potassium chloride SA (KLOR-CON M) 20 MEQ tablet Take 1 tablet (20 mEq total) by mouth 2 (two) times daily. 08/04/21   Daleen Bo, MD  ?ranitidine (ZANTAC) 150 MG capsule Take 1 capsule (150 mg total) by mouth 2 (two) times daily. 03/21/17   Bonnielee Haff, MD  ?   ? ?Allergies    ?Patient has no known allergies.   ? ?Review of Systems   ?Review of Systems  ?Unable to perform ROS: Other  ? ?Physical Exam ?Updated Vital Signs ?BP 115/65 (BP Location: Right Arm)   Pulse 68   Resp 19   SpO2 100%  ?Physical Exam ?Vitals and nursing note reviewed.  ?Constitutional:   ?   Appearance: He is not ill-appearing.  ?HENT:  ?   Head: Normocephalic and atraumatic.  ?Eyes:  ?   Conjunctiva/sclera: Conjunctivae normal.  ?Cardiovascular:  ?   Rate and Rhythm: Normal rate and regular rhythm.  ?Pulmonary:  ?   Effort: Pulmonary effort is normal.  ?   Breath sounds: Normal breath sounds.  ?Abdominal:  ?   Palpations: Abdomen is soft.  ?   Tenderness: There is no abdominal tenderness.  ?Musculoskeletal:  ?   Cervical back: Neck supple.  ?Skin: ?   General: Skin is  warm and dry.  ?Neurological:  ?   Mental Status: He is easily aroused.  ? ? ?ED Results / Procedures / Treatments   ?Labs ?(all labs ordered are listed, but only abnormal results are displayed) ?Labs Reviewed  ?CBC WITH DIFFERENTIAL/PLATELET - Abnormal; Notable for the following components:  ?    Result Value  ? RBC 3.98 (*)   ? HCT 38.6 (*)   ? Monocytes Absolute 1.2 (*)   ? Abs Immature Granulocytes 0.25 (*)   ? All other components within normal limits  ?BASIC METABOLIC PANEL - Abnormal; Notable for the following components:  ? Glucose, Bld 103 (*)   ? Calcium 8.8 (*)   ? Anion gap 4 (*)   ? All other components within normal limits  ?ETHANOL  ? ? ?EKG ?None ? ?Radiology ?No results found. ? ?Procedures ?Procedures  ? ? ?Medications Ordered in ED ?Medications - No data to display ? ?ED Course/ Medical Decision Making/ A&P ?Clinical Course as of 09/10/21 2352  ?Tue Sep 10, 2021  ?2141 Alcohol, Ethyl (B)(!): 287 [MV]  ?  ?Clinical Course User Index ?[MV] Eustaquio Maize, PA-C  ? ?                        ?Medical  Decision Making ?31 year old male who presents to the ED today for alcohol intoxication.  Found via EMS staff during the street.  On arrival to the ED vitals are stable.  History significantly limited secondary to alcohol intoxication.  Patient is able to tell me he is hungry.  He is found laying in bed, sleeping.  He is easily arousable.  As I am leaving the bed he spontaneously sits upright in bed and then sits back down and falls asleep.  We will plan for lab work including EtOH.  Patient will need to metabolize prior to discharge. No signs of trauma of any kind at this time.  ? ?CBC without leukocytosis. Hgb stable.  ?BMP without electrolyte abnormalities.  ?EtOH elevated at 287.  ? ?On reevaluation pt continues to sleep soundly. He is arousable however does not appear stable for discharge at this time. Will continue to monitor patient and have him metabolize.  ? ?Amount and/or Complexity of Data  Reviewed ?Labs: ordered. Decision-making details documented in ED Course. ? ? ?At shift change case signed out to Wyn Quaker, [place], who will dispo patient accordingly after he is able to ambulate safely without assistance.  ? ? ? ? ? ? ? ?Final Clinical Impression(s) / ED Diagnoses ?Final diagnoses:  ?None  ? ? ?Rx / DC Orders ?ED Discharge Orders   ? ? None  ? ?  ? ? ?  ?Eustaquio Maize, PA-C ?09/10/21 2353 ? ?  ?Malvin Johns, MD ?09/13/21 0831 ? ?

## 2021-09-10 NOTE — ED Triage Notes (Signed)
Pt BIB EMS for alcohol intox, people were reporting that he was staggering in the street. When EMS arrived he said the he drank "too much," pt is alert when shaken awake  ?

## 2021-09-11 NOTE — ED Notes (Signed)
Pt able to walk to bathroom with no assistance. Pt had bm in clothes. Pt removed own clothes and showered by himself. Pt steady on his feet and needed no assistance. Pt able to get dressed by himself. Nad nard. Pa notified ?

## 2021-09-17 ENCOUNTER — Encounter (HOSPITAL_COMMUNITY): Payer: Self-pay

## 2021-09-17 ENCOUNTER — Emergency Department (HOSPITAL_COMMUNITY)
Admission: EM | Admit: 2021-09-17 | Discharge: 2021-09-17 | Discharge status: Against Medical Advice | Payer: 59 | Attending: Emergency Medicine | Admitting: Emergency Medicine

## 2021-09-17 ENCOUNTER — Other Ambulatory Visit: Payer: Self-pay

## 2021-09-17 DIAGNOSIS — Z5900 Homelessness unspecified: Secondary | ICD-10-CM | POA: Diagnosis not present

## 2021-09-17 DIAGNOSIS — Z5321 Procedure and treatment not carried out due to patient leaving prior to being seen by health care provider: Secondary | ICD-10-CM | POA: Insufficient documentation

## 2021-09-17 DIAGNOSIS — J00 Acute nasopharyngitis [common cold]: Secondary | ICD-10-CM | POA: Diagnosis not present

## 2021-09-17 DIAGNOSIS — F10129 Alcohol abuse with intoxication, unspecified: Secondary | ICD-10-CM | POA: Diagnosis not present

## 2021-09-17 NOTE — ED Triage Notes (Signed)
Pt BIB Ems. Pt states that he had some whiskey earlier today and he is now cold. Pt is homeless.  ?

## 2021-09-17 NOTE — ED Notes (Signed)
Pt told registration that he was leaving.  ?

## 2021-09-21 ENCOUNTER — Encounter (HOSPITAL_COMMUNITY): Payer: Self-pay

## 2021-09-21 ENCOUNTER — Emergency Department (HOSPITAL_COMMUNITY)
Admission: EM | Admit: 2021-09-21 | Discharge: 2021-09-21 | Disposition: A | Discharge status: Home / Self Care | Payer: 59 | Attending: Emergency Medicine | Admitting: Emergency Medicine

## 2021-09-21 ENCOUNTER — Other Ambulatory Visit: Payer: Self-pay

## 2021-09-21 ENCOUNTER — Emergency Department (HOSPITAL_COMMUNITY): Payer: 59

## 2021-09-21 DIAGNOSIS — E860 Dehydration: Secondary | ICD-10-CM | POA: Diagnosis not present

## 2021-09-21 DIAGNOSIS — F10920 Alcohol use, unspecified with intoxication, uncomplicated: Secondary | ICD-10-CM

## 2021-09-21 DIAGNOSIS — R7989 Other specified abnormal findings of blood chemistry: Secondary | ICD-10-CM

## 2021-09-21 DIAGNOSIS — E876 Hypokalemia: Secondary | ICD-10-CM | POA: Diagnosis not present

## 2021-09-21 DIAGNOSIS — F10929 Alcohol use, unspecified with intoxication, unspecified: Secondary | ICD-10-CM | POA: Insufficient documentation

## 2021-09-21 DIAGNOSIS — R079 Chest pain, unspecified: Secondary | ICD-10-CM | POA: Diagnosis present

## 2021-09-21 DIAGNOSIS — R0789 Other chest pain: Secondary | ICD-10-CM | POA: Insufficient documentation

## 2021-09-21 LAB — BASIC METABOLIC PANEL
Anion gap: 10 (ref 5–15)
BUN: 18 mg/dL (ref 6–20)
CO2: 26 mmol/L (ref 22–32)
Calcium: 9.6 mg/dL (ref 8.9–10.3)
Chloride: 105 mmol/L (ref 98–111)
Creatinine, Ser: 1.3 mg/dL — ABNORMAL HIGH (ref 0.61–1.24)
GFR, Estimated: >60 mL/min (ref >60)
Glucose, Bld: 109 mg/dL — ABNORMAL HIGH (ref 70–99)
Potassium: 3.2 mmol/L — ABNORMAL LOW (ref 3.5–5.1)
Sodium: 141 mmol/L (ref 135–145)

## 2021-09-21 LAB — CBC
HCT: 38.8 % — ABNORMAL LOW (ref 39.0–52.0)
Hemoglobin: 12.7 g/dL — ABNORMAL LOW (ref 13.0–17.0)
MCH: 32 pg (ref 26.0–34.0)
MCHC: 32.7 g/dL (ref 30.0–36.0)
MCV: 97.7 fL (ref 80.0–100.0)
Platelets: 311 10*3/uL (ref 150–400)
RBC: 3.97 MIL/uL — ABNORMAL LOW (ref 4.22–5.81)
RDW: 13.7 % (ref 11.5–15.5)
WBC: 3.5 10*3/uL — ABNORMAL LOW (ref 4.0–10.5)
nRBC: 0 % (ref 0.0–0.2)

## 2021-09-21 LAB — TROPONIN I (HIGH SENSITIVITY): Troponin I (High Sensitivity): 3 ng/L (ref <18)

## 2021-09-21 MED ORDER — POTASSIUM CHLORIDE CRYS ER 20 MEQ PO TBCR
40.0000 meq | EXTENDED_RELEASE_TABLET | Freq: Once | ORAL | Status: AC
  Administered 2021-09-21: 40 meq via ORAL
  Filled 2021-09-21: qty 2

## 2021-09-21 NOTE — ED Notes (Signed)
Pt ambulated back to room from bathroom w/ steady gait. ?

## 2021-09-21 NOTE — ED Triage Notes (Signed)
Pt BIB EMS with c/o chest pain and burning that gets worse when he drinks. Pt endorses ETOH use tonight. Burning and tighness are in central chest radiating to throat. Denies SOB. ?

## 2021-09-21 NOTE — ED Notes (Signed)
Reviewed discharge instructions with patient. Follow-up care reviewed. Patient verbalized understanding. Patient A&Ox4, VSS, and ambulatory with steady gait upon discharge.  

## 2021-09-21 NOTE — ED Provider Notes (Signed)
?[facility] ?Provider Note ? ? ?CSN: [id number] ?Arrival date & time: 09/21/21  0327 ? ?  ? ?History ? ?Chief Complaint  ?Patient presents with  ? Chest Pain  ? ? ?David Burnett is a 31 y.o. male who presents to the ED today brought in by EMS with complaint of gradual onset, constant, burning sensation in his chest as well as vomiting that began last night.  Patient does admit to drinking alcohol last night.  He is very well-known to our system and presents quite frequently for alcohol intoxication.  He states the chest burning with radiation into his throat began with vomiting.  He states currently he feels better.  He is requesting a shower at this time.  No other complaints currently.  ? ?The history is provided by the patient and medical records.  ? ?  ? ?Home Medications ?Prior to Admission medications   ?Medication Sig Start Date End Date Taking? Authorizing Provider  ?Multiple Vitamin (MULTIVITAMIN WITH MINERALS) TABS tablet Take 1 tablet by mouth daily. 03/21/17   Bonnielee Haff, MD  ?potassium chloride SA (KLOR-CON M) 20 MEQ tablet Take 1 tablet (20 mEq total) by mouth 2 (two) times daily. 08/04/21   Daleen Bo, MD  ?ranitidine (ZANTAC) 150 MG capsule Take 1 capsule (150 mg total) by mouth 2 (two) times daily. 03/21/17   Bonnielee Haff, MD  ?   ? ?Allergies    ?Patient has no known allergies.   ? ?Review of Systems   ?Review of Systems  ?Constitutional:  Negative for chills and fever.  ?Respiratory:  Negative for cough and shortness of breath.   ?Cardiovascular:  Positive for chest pain.  ?Gastrointestinal:  Positive for vomiting. Negative for abdominal pain.  ?All other systems reviewed and are negative. ? ?Physical Exam ?Updated Vital Signs ?BP (!) 142/71 (BP Location: Left Arm)   Pulse 90   Temp 97.9 ?F (36.6 ?C) (Oral)   Resp 17   Ht '5\' 5"'$  (1.651 m)   Wt 77 kg   SpO2 100%   BMI 28.25 kg/m?  ?Physical Exam ?Vitals and nursing note reviewed.  ?Constitutional:    ?   Appearance: He is not ill-appearing or diaphoretic.  ?   Comments: Appears intoxicated  ?HENT:  ?   Head: Normocephalic and atraumatic.  ?Eyes:  ?   Conjunctiva/sclera: Conjunctivae normal.  ?Cardiovascular:  ?   Rate and Rhythm: Normal rate and regular rhythm.  ?   Pulses:     ?     Radial pulses are 2+ on the right side and 2+ on the left side.  ?Pulmonary:  ?   Effort: Pulmonary effort is normal.  ?   Breath sounds: Normal breath sounds. No decreased breath sounds, wheezing, rhonchi or rales.  ?Abdominal:  ?   Palpations: Abdomen is soft.  ?   Tenderness: There is no abdominal tenderness.  ?Musculoskeletal:  ?   Cervical back: Neck supple.  ?Skin: ?   General: Skin is warm and dry.  ?Neurological:  ?   Mental Status: He is alert.  ? ? ?ED Results / Procedures / Treatments   ?Labs ?(all labs ordered are listed, but only abnormal results are displayed) ?Labs Reviewed  ?BASIC METABOLIC PANEL - Abnormal; Notable for the following components:  ?    Result Value  ? Potassium 3.2 (*)   ? Glucose, Bld 109 (*)   ? Creatinine, Ser 1.30 (*)   ? All other components within normal limits  ?  CBC - Abnormal; Notable for the following components:  ? WBC 3.5 (*)   ? RBC 3.97 (*)   ? Hemoglobin 12.7 (*)   ? HCT 38.8 (*)   ? All other components within normal limits  ?TROPONIN I (HIGH SENSITIVITY)  ?TROPONIN I (HIGH SENSITIVITY)  ? ? ?EKG ?EKG Interpretation ? ?Date/Time:  Saturday Sep 21 2021 03:39:58 EDT ?Ventricular Rate:  86 ?PR Interval:  186 ?QRS Duration: 88 ?QT Interval:  340 ?QTC Calculation: 406 ?R Axis:   85 ?Text Interpretation: Normal sinus rhythm Nonspecific T wave abnormality Abnormal ECG When compared with ECG of 23-Jul-2021 19:56, PREVIOUS ECG IS PRESENT Confirmed by Octaviano Glow [phone]) on 09/21/2021 8:43:12 AM ? ?Radiology ?DG Chest 2 View ? ?Result Date: 09/21/2021 ?CLINICAL DATA:  Chest pain and burning. EXAM: CHEST - 2 VIEW COMPARISON:  None Available. FINDINGS: The heart size and mediastinal contours are  within normal limits. Both lungs are clear. The visualized skeletal structures are unremarkable. IMPRESSION: No active cardiopulmonary disease.  Stable chest. Electronically Signed   By: Telford Nab M.D.   On: 09/21/2021 04:18   ? ?Procedures ?Procedures  ? ? ?Medications Ordered in ED ?Medications  ?potassium chloride SA (KLOR-CON M) CR tablet 40 mEq (has no administration in time range)  ? ? ?ED Course/ Medical Decision Making/ A&P ?  ?                        ?Medical Decision Making ?31 year old male who presents to the ED today with complaint of burning sensation in his chest with radiation to throat as well as vomiting that began last night.  Pain associated with alcohol intoxication.  On arrival to the ED today vitals are stable.  Patient's work-up was started including EKG does show some nonspecific T wave abnormalities.  Chest x-ray clear.  Troponin of 3.  CBC without leukocytosis and hemoglobin of 12.7.  Per chart review previous hemoglobin of 13.0.  BMP does show a slightly elevated creatinine today at 1.30 however patient's baseline around 0.90-1.14.  Potassium of 3.2.  No other electrolyte abnormalities.  Suspect creatinine slightly elevated secondary to dehydration as well as alcohol intoxication.  Suspect potassium is slightly low due to vomiting.  ? ?On reevaluation Patient currently states that he feels improved. He has not vomited since being in the ED.  He is requesting a shower at this time.  He does have history of homelessness.  He is quite well-known to our system and frequently visits the ED for alcohol intoxication.  He has been here for over 5 hours has been able to ambulate successfully to the restroom without assistance.  Do not feel he requires additional metabolization of EtOH at this time.  Heart score is 3.  I do suspect that his chest pain is related to vomiting and I do not feel he requires additional repeat troponin at this time.  He has been provided with a dose of K-Dur 40 mEq.   He is advised that he will need to have his potassium and creatinine level rechecked in about 1 to 2 weeks.  Information given for [place] and wellness for follow-up.  Patient is in agreement with plan at this time and otherwise stable for discharge home.  ? ?Amount and/or Complexity of Data Reviewed ?Labs: ordered. ?Radiology: ordered. ? ? ? ? ? ? ? ? ? ?Final Clinical Impression(s) / ED Diagnoses ?Final diagnoses:  ?Nonspecific chest pain  ?Alcoholic intoxication without complication ([place])  ?  Hypokalemia  ?Elevated serum creatinine  ? ? ?Rx / DC Orders ?ED Discharge Orders   ? ? None  ? ?  ? ? ? ?Discharge Instructions   ? ?  ?Your potassium level was slightly low today and your kidney function was slightly elevated. Both of these could be related to dehydration/vomiting. It is recommended that you drink plenty of fluids to stay hydrated. You will need to have both of these levels rechecked in 1-2 weeks.  ? ?You can follow up with [facility] and Wellness for primary care needs if you do not have a PCP. Otherwise please follow up with your PCP for further eval.  ? ?Return to the ED for any new/worsening symptoms  ? ? ? ? ?  ?Eustaquio Maize, PA-C ?09/21/21 0037 ? ?  ?Wyvonnia Dusky, MD ?09/21/21 1256 ? ?

## 2021-09-21 NOTE — Discharge Instructions (Addendum)
Your potassium level was slightly low today and your kidney function was slightly elevated. Both of these could be related to dehydration/vomiting. It is recommended that you drink plenty of fluids to stay hydrated. You will need to have both of these levels rechecked in 1-2 weeks.  ? ?You can follow up with [facility] and Wellness for primary care needs if you do not have a PCP. Otherwise please follow up with your PCP for further eval.  ? ?Return to the ED for any new/worsening symptoms  ?

## 2021-12-07 ENCOUNTER — Encounter (HOSPITAL_COMMUNITY): Payer: Self-pay | Admitting: Emergency Medicine

## 2021-12-07 ENCOUNTER — Other Ambulatory Visit: Payer: Self-pay

## 2021-12-07 ENCOUNTER — Emergency Department (HOSPITAL_COMMUNITY): Payer: 59

## 2021-12-07 ENCOUNTER — Emergency Department (HOSPITAL_COMMUNITY)
Admission: EM | Admit: 2021-12-07 | Discharge: 2021-12-07 | Disposition: A | Discharge status: Home / Self Care | Payer: 59 | Attending: Emergency Medicine | Admitting: Emergency Medicine

## 2021-12-07 DIAGNOSIS — F1910 Other psychoactive substance abuse, uncomplicated: Secondary | ICD-10-CM

## 2021-12-07 DIAGNOSIS — R4182 Altered mental status, unspecified: Secondary | ICD-10-CM | POA: Diagnosis present

## 2021-12-07 DIAGNOSIS — Y905 Blood alcohol level of 100-119 mg/100 ml: Secondary | ICD-10-CM | POA: Insufficient documentation

## 2021-12-07 DIAGNOSIS — F10129 Alcohol abuse with intoxication, unspecified: Secondary | ICD-10-CM | POA: Insufficient documentation

## 2021-12-07 DIAGNOSIS — R61 Generalized hyperhidrosis: Secondary | ICD-10-CM | POA: Diagnosis not present

## 2021-12-07 LAB — COMPREHENSIVE METABOLIC PANEL
ALT: 24 U/L (ref 0–44)
AST: 28 U/L (ref 15–41)
Albumin: 4.1 g/dL (ref 3.5–5.0)
Alkaline Phosphatase: 41 U/L (ref 38–126)
Anion gap: 10 (ref 5–15)
BUN: 19 mg/dL (ref 6–20)
CO2: 25 mmol/L (ref 22–32)
Calcium: 9 mg/dL (ref 8.9–10.3)
Chloride: 102 mmol/L (ref 98–111)
Creatinine, Ser: 0.99 mg/dL (ref 0.61–1.24)
GFR, Estimated: >60 mL/min (ref >60)
Glucose, Bld: 101 mg/dL — ABNORMAL HIGH (ref 70–99)
Potassium: 3.5 mmol/L (ref 3.5–5.1)
Sodium: 137 mmol/L (ref 135–145)
Total Bilirubin: 0.5 mg/dL (ref 0.3–1.2)
Total Protein: 7.7 g/dL (ref 6.5–8.1)

## 2021-12-07 LAB — RAPID URINE DRUG SCREEN, HOSP PERFORMED
Amphetamines: NOT DETECTED
Barbiturates: NOT DETECTED
Benzodiazepines: NOT DETECTED
Cocaine: POSITIVE — AB
Opiates: NOT DETECTED
Tetrahydrocannabinol: POSITIVE — AB

## 2021-12-07 LAB — URINALYSIS, ROUTINE W REFLEX MICROSCOPIC
Bilirubin Urine: NEGATIVE
Glucose, UA: NEGATIVE mg/dL
Hgb urine dipstick: NEGATIVE
Ketones, ur: NEGATIVE mg/dL
Leukocytes,Ua: NEGATIVE
Nitrite: NEGATIVE
Protein, ur: NEGATIVE mg/dL
Specific Gravity, Urine: 1.021 (ref 1.005–1.030)
pH: 5 (ref 5.0–8.0)

## 2021-12-07 LAB — CBG MONITORING, ED: Glucose-Capillary: 107 mg/dL — ABNORMAL HIGH (ref 70–99)

## 2021-12-07 LAB — CBC WITH DIFFERENTIAL/PLATELET
Abs Immature Granulocytes: 0.01 10*3/uL (ref 0.00–0.07)
Basophils Absolute: 0 10*3/uL (ref 0.0–0.1)
Basophils Relative: 1 %
Eosinophils Absolute: 0.1 10*3/uL (ref 0.0–0.5)
Eosinophils Relative: 2 %
HCT: 40.4 % (ref 39.0–52.0)
Hemoglobin: 14 g/dL (ref 13.0–17.0)
Immature Granulocytes: 0 %
Lymphocytes Relative: 34 %
Lymphs Abs: 1.1 10*3/uL (ref 0.7–4.0)
MCH: 33.7 pg (ref 26.0–34.0)
MCHC: 34.7 g/dL (ref 30.0–36.0)
MCV: 97.3 fL (ref 80.0–100.0)
Monocytes Absolute: 0.4 10*3/uL (ref 0.1–1.0)
Monocytes Relative: 11 %
Neutro Abs: 1.7 10*3/uL (ref 1.7–7.7)
Neutrophils Relative %: 52 %
Platelets: 307 10*3/uL (ref 150–400)
RBC: 4.15 MIL/uL — ABNORMAL LOW (ref 4.22–5.81)
RDW: 13.6 % (ref 11.5–15.5)
WBC: 3.4 10*3/uL — ABNORMAL LOW (ref 4.0–10.5)
nRBC: 0 % (ref 0.0–0.2)

## 2021-12-07 LAB — ETHANOL: Alcohol, Ethyl (B): 103 mg/dL — ABNORMAL HIGH (ref <10)

## 2021-12-07 LAB — LIPASE, BLOOD: Lipase: 31 U/L (ref 11–51)

## 2021-12-07 MED ORDER — SODIUM CHLORIDE 0.9 % IV BOLUS
1000.0000 mL | Freq: Once | INTRAVENOUS | Status: AC
  Administered 2021-12-07: 1000 mL via INTRAVENOUS

## 2021-12-07 MED ORDER — NALOXONE HCL 4 MG/0.1ML NA LIQD
1.0000 | Freq: Once | NASAL | Status: AC
  Administered 2021-12-07: 1 via NASAL
  Filled 2021-12-07: qty 4

## 2021-12-07 NOTE — ED Provider Notes (Signed)
[facility] DEPT Provider Note   CSN: [id number] Arrival date & time: 12/07/21  1349     History  Chief Complaint  Patient presents with   Alcohol Intoxication    David Burnett is a 31 y.o. male.  Who presents to the emergency department with altered mental status and intoxication.  Level 5 caveat: Altered mental status  Patient does not respond to me on my interview.  I even sternal rub him and he does not open his eyes to answer questions.  Per EMS report, patient was found in a motel, minimally responsive.  He was found in her room surrounded by beer cans and marijuana.  Apparently when EMS asked how much alcohol patient had consumed he stated "too much."   Alcohol Intoxication       Home Medications Prior to Admission medications   Medication Sig Start Date End Date Taking? Authorizing Provider  Multiple Vitamin (MULTIVITAMIN WITH MINERALS) TABS tablet Take 1 tablet by mouth daily. 03/21/17   Bonnielee Haff, MD  potassium chloride SA (KLOR-CON M) 20 MEQ tablet Take 1 tablet (20 mEq total) by mouth 2 (two) times daily. 08/04/21   Daleen Bo, MD  ranitidine (ZANTAC) 150 MG capsule Take 1 capsule (150 mg total) by mouth 2 (two) times daily. 03/21/17   Bonnielee Haff, MD      Allergies    Patient has no known allergies.    Review of Systems   Review of Systems  Unable to perform ROS: Patient unresponsive   Physical Exam Updated Vital Signs BP 110/66   Pulse 76   Temp (!) 97.5 F (36.4 C) (Oral)   Resp 18   SpO2 99%  Physical Exam Vitals and nursing note reviewed.  Constitutional:      Appearance: He is ill-appearing and diaphoretic.  HENT:     Head: Normocephalic and atraumatic.     Mouth/Throat:     Mouth: Mucous membranes are moist.  Eyes:     General: No scleral icterus.    Pupils: Pupils are equal, round, and reactive to light.     Right eye: Pupil is sluggish.     Left eye: Pupil is sluggish.  Cardiovascular:      Rate and Rhythm: Normal rate and regular rhythm.     Pulses: Normal pulses.  Pulmonary:     Effort: Pulmonary effort is normal. No respiratory distress.     Breath sounds: Normal breath sounds.  Abdominal:     General: Bowel sounds are normal. There is no distension.     Palpations: Abdomen is soft.  Musculoskeletal:        General: No deformity or signs of injury.     Cervical back: Neck supple.  Skin:    Capillary Refill: Capillary refill takes less than 2 seconds.     Coloration: Skin is jaundiced.  Neurological:     Mental Status: He is unresponsive.  Psychiatric:        Attention and Perception: He is inattentive.     ED Results / Procedures / Treatments   Labs (all labs ordered are listed, but only abnormal results are displayed) Labs Reviewed  CBG MONITORING, ED - Abnormal; Notable for the following components:      Result Value   Glucose-Capillary 107 (*)    All other components within normal limits  COMPREHENSIVE METABOLIC PANEL  ETHANOL  LIPASE, BLOOD  CBC WITH DIFFERENTIAL/PLATELET  URINALYSIS, ROUTINE W REFLEX MICROSCOPIC   EKG None  Radiology No results  found.  Procedures Procedures   Medications Ordered in ED Medications  sodium chloride 0.9 % bolus 1,000 mL (1,000 mLs Intravenous New Bag/Given 12/07/21 1450)    ED Course/ Medical Decision Making/ A&P                           Medical Decision Making Amount and/or Complexity of Data Reviewed Labs: ordered. Radiology: ordered.   Care of patient is being handed off to [facility], PA-C at this time.  Patient is pending complete work-up.  He was found almost unresponsive by EMS with severe surrounding him.  On my initial exam he is minimally responsive.  He is pending lab work as well as a CT of his head.  If all of this is normal, he will need to metabolize to freedom.  Final disposition pending completed work-up and clinical course. Final Clinical Impression(s) / ED Diagnoses Final  diagnoses:  None    Rx / DC Orders ED Discharge Orders     None         Mickie Hillier, PA-C 12/07/21 1540    Daleen Bo, MD 12/07/21 1658

## 2021-12-07 NOTE — ED Provider Notes (Signed)
Care assumed from previous provider PA Theodis Blaze. Please see note for further details. In short 31 year old male who was found in a motel with marijuana and alcohol.  He was not Narcaned at the scene.  Has a history of alcohol use disorder.  Physical Exam  BP 107/60 (BP Location: Left Arm)   Pulse 76   Temp (!) 97.5 F (36.4 C) (Oral)   Resp 16   SpO2 98%   Physical Exam Vitals and nursing note reviewed.  Constitutional:      Appearance: Normal appearance.  HENT:     Head: Normocephalic and atraumatic.  Eyes:     General: No scleral icterus.    Conjunctiva/sclera: Conjunctivae normal.     Comments: Pinpoint pupils, minimally reaction  Pulmonary:     Effort: Pulmonary effort is normal. No respiratory distress.  Skin:    Findings: No rash.  Neurological:     Mental Status: He is alert.  Psychiatric:        Mood and Affect: Mood normal.     Procedures  Procedures  ED Course / MDM   Clinical Course as of 12/07/21 1752  Sat Dec 07, 2021  1744 Lab has been contacted multiple times about patient's ethanol.  They said they are looking for.  We will redraw it at this time.  Patient is asking to leave.  He is alert and oriented x3.  Clinically sober.  Likely has further metabolized at this time and decreased consciousness appears to be secondary to opioid use given Narcan response.  We will send off a new ethanol and likely discharge the patient. [MR]    Clinical Course User Index [MR] Alondra Vandeven, Cecilio Asper, PA-C   Medical Decision Making Amount and/or Complexity of Data Reviewed Labs: ordered. Radiology: ordered.  Risk Prescription drug management.   Patient reevaluated by me in the hallway.  He is minimally responsive.  Will open eyes to sternal rub.  Unable to respond to my questions.  Pinpoint pupils.  We will proceed with Narcan.  Spoke with Dr. Jeanell Sparrow about the patient  Imaging: Head CT ordered, viewed and interpreted by me.  I agree with the radiologist that there are  no abnormalities.  No head bleed.   Patient responded to Narcan.  Immediately sat up and started talking (jumbled words that do not make sense but talking nonetheless.)  Now ambulating to the restroom to give urine sample.   6p: Patient reports that he is fine and is going to leave.  Discharge printed, clinically sober.   Rhae Hammock, PA-C 12/07/21 1803    Pattricia Boss, MD 12/07/21 [phone]

## 2021-12-07 NOTE — ED Triage Notes (Addendum)
Pt BIB EMS from motel, minimum response. Responds to voice, pain,  and loud stimuli. Pt found in room surrounded by multiple beer cans and marijuana. When asked about how much alcohol consumption, pt stated, "too much!" Pupils pin point and jaundice. EMS placed 20 LAC  BP 108/66 P 78 RR 20 CBG 107

## 2021-12-07 NOTE — Discharge Instructions (Signed)
Please stop using drugs.  It is also important that you stop drinking alcohol because this is causing a lot of damage to your body.  Return with any worsening symptoms.

## 2021-12-09 ENCOUNTER — Emergency Department (HOSPITAL_COMMUNITY)
Admission: EM | Admit: 2021-12-09 | Discharge: 2021-12-09 | Disposition: A | Discharge status: Home / Self Care | Payer: 59 | Attending: Emergency Medicine | Admitting: Emergency Medicine

## 2021-12-09 ENCOUNTER — Encounter (HOSPITAL_COMMUNITY): Payer: Self-pay

## 2021-12-09 DIAGNOSIS — F10920 Alcohol use, unspecified with intoxication, uncomplicated: Secondary | ICD-10-CM

## 2021-12-09 DIAGNOSIS — T402X1A Poisoning by other opioids, accidental (unintentional), initial encounter: Secondary | ICD-10-CM | POA: Diagnosis present

## 2021-12-09 DIAGNOSIS — T40604A Poisoning by unspecified narcotics, undetermined, initial encounter: Secondary | ICD-10-CM

## 2021-12-09 LAB — CBC WITH DIFFERENTIAL/PLATELET
Abs Immature Granulocytes: 0.01 10*3/uL (ref 0.00–0.07)
Basophils Absolute: 0 10*3/uL (ref 0.0–0.1)
Basophils Relative: 1 %
Eosinophils Absolute: 0.2 10*3/uL (ref 0.0–0.5)
Eosinophils Relative: 4 %
HCT: 39.5 % (ref 39.0–52.0)
Hemoglobin: 13.3 g/dL (ref 13.0–17.0)
Immature Granulocytes: 0 %
Lymphocytes Relative: 46 %
Lymphs Abs: 1.8 10*3/uL (ref 0.7–4.0)
MCH: 32.9 pg (ref 26.0–34.0)
MCHC: 33.7 g/dL (ref 30.0–36.0)
MCV: 97.8 fL (ref 80.0–100.0)
Monocytes Absolute: 0.5 10*3/uL (ref 0.1–1.0)
Monocytes Relative: 12 %
Neutro Abs: 1.4 10*3/uL — ABNORMAL LOW (ref 1.7–7.7)
Neutrophils Relative %: 37 %
Platelets: 287 10*3/uL (ref 150–400)
RBC: 4.04 MIL/uL — ABNORMAL LOW (ref 4.22–5.81)
RDW: 13.7 % (ref 11.5–15.5)
WBC: 3.9 10*3/uL — ABNORMAL LOW (ref 4.0–10.5)
nRBC: 0 % (ref 0.0–0.2)

## 2021-12-09 LAB — COMPREHENSIVE METABOLIC PANEL
ALT: 23 U/L (ref 0–44)
AST: 26 U/L (ref 15–41)
Albumin: 4 g/dL (ref 3.5–5.0)
Alkaline Phosphatase: 49 U/L (ref 38–126)
Anion gap: 10 (ref 5–15)
BUN: 19 mg/dL (ref 6–20)
CO2: 21 mmol/L — ABNORMAL LOW (ref 22–32)
Calcium: 8.7 mg/dL — ABNORMAL LOW (ref 8.9–10.3)
Chloride: 108 mmol/L (ref 98–111)
Creatinine, Ser: 1 mg/dL (ref 0.61–1.24)
GFR, Estimated: >60 mL/min (ref >60)
Glucose, Bld: 92 mg/dL (ref 70–99)
Potassium: 3.8 mmol/L (ref 3.5–5.1)
Sodium: 139 mmol/L (ref 135–145)
Total Bilirubin: 0.3 mg/dL (ref 0.3–1.2)
Total Protein: 6.4 g/dL — ABNORMAL LOW (ref 6.5–8.1)

## 2021-12-09 LAB — ETHANOL: Alcohol, Ethyl (B): 105 mg/dL — ABNORMAL HIGH (ref <10)

## 2021-12-09 LAB — ACETAMINOPHEN LEVEL: Acetaminophen (Tylenol), Serum: <10 ug/mL — ABNORMAL LOW (ref 10–30)

## 2021-12-09 LAB — RAPID HIV SCREEN (HIV 1/2 AB+AG)
HIV 1/2 Antibodies: NONREACTIVE
HIV-1 P24 Antigen - HIV24: NONREACTIVE

## 2021-12-09 LAB — HEPATITIS B SURFACE ANTIGEN: Hepatitis B Surface Ag: NONREACTIVE

## 2021-12-09 LAB — SALICYLATE LEVEL: Salicylate Lvl: <7 mg/dL — ABNORMAL LOW (ref 7.0–30.0)

## 2021-12-09 LAB — CBG MONITORING, ED: Glucose-Capillary: 108 mg/dL — ABNORMAL HIGH (ref 70–99)

## 2021-12-09 NOTE — ED Provider Notes (Signed)
[facility] EMERGENCY DEPARTMENT Provider Note   CSN: [id number] Arrival date & time: 12/09/21  1451     History  Chief Complaint  Patient presents with   Drug Overdose    David Burnett is a 31 y.o. male.  Patient is a 31 year old male presenting to the emergency department for suspected opioid overdose.  Per EMS, the patient was found on the sidewalk unresponsive and not breathing.  Firefighters gave 1 mg of intranasal Narcan and by the time medics arrived the patient was awake and alert ambulating at the scene.  IV access was obtained by medics but did have an accidental needlestick.  They state in route to the hospital the patient became more drowsy but has not required another dose of Narcan at this time.  He states that the patient was unable to give any additional history in route.  The history is provided by the EMS personnel. The history is limited by the condition of the patient.  Drug Overdose       Home Medications Prior to Admission medications   Medication Sig Start Date End Date Taking? Authorizing Provider  Multiple Vitamin (MULTIVITAMIN WITH MINERALS) TABS tablet Take 1 tablet by mouth daily. 03/21/17   Bonnielee Haff, MD  potassium chloride SA (KLOR-CON M) 20 MEQ tablet Take 1 tablet (20 mEq total) by mouth 2 (two) times daily. 08/04/21   Daleen Bo, MD  ranitidine (ZANTAC) 150 MG capsule Take 1 capsule (150 mg total) by mouth 2 (two) times daily. 03/21/17   Bonnielee Haff, MD      Allergies    Patient has no known allergies.    Review of Systems   Review of Systems  Unable to perform ROS: Mental status change    Physical Exam Updated Vital Signs BP 101/60   Pulse 79   Resp 16   SpO2 100%  Physical Exam Constitutional:      Comments: Asleep, arousable by noxious stimuli, localizing pain  HENT:     Head: Normocephalic and atraumatic.     Mouth/Throat:     Mouth: Mucous membranes are moist.  Eyes:     Comments: Pupils  pinpoint and equal bilaterally  Cardiovascular:     Rate and Rhythm: Normal rate and regular rhythm.  Pulmonary:     Effort: Pulmonary effort is normal.     Breath sounds: Normal breath sounds.  Abdominal:     General: Abdomen is flat.     Palpations: Abdomen is soft.  Musculoskeletal:        General: No tenderness or deformity.     Cervical back: Normal range of motion and neck supple.  Skin:    General: Skin is warm and dry.     Comments: No bruising, abrasions or laceration  Neurological:     Mental Status: He is disoriented.     ED Results / Procedures / Treatments   Labs (all labs ordered are listed, but only abnormal results are displayed) Labs Reviewed  CBG MONITORING, ED - Abnormal; Notable for the following components:      Result Value   Glucose-Capillary 108 (*)    All other components within normal limits  RAPID HIV SCREEN (HIV 1/2 AB+AG)  HEPATITIS B SURFACE ANTIGEN  HCV AB W REFLEX TO QUANT PCR    EKG EKG Interpretation  Date/Time:  Monday December 09 2021 14:53:40 EDT Ventricular Rate:  84 PR Interval:  173 QRS Duration: 88 QT Interval:  358 QTC Calculation: 424 R Axis:  71 Text Interpretation: Sinus rhythm Normal EKG No significant change since last tracing Confirmed by Oneal Deputy [phone]) on 12/09/2021 3:09:13 PM  Radiology CT Head Wo Contrast  Result Date: 12/07/2021 CLINICAL DATA:  Mental status change. Alcohol consumption. Pinpoint pupils and jaundice. EXAM: CT HEAD WITHOUT CONTRAST TECHNIQUE: Contiguous axial images were obtained from the base of the skull through the vertex without intravenous contrast. RADIATION DOSE REDUCTION: This exam was performed according to the departmental dose-optimization program which includes automated exposure control, adjustment of the mA and/or kV according to patient size and/or use of iterative reconstruction technique. COMPARISON:  07/23/2021 FINDINGS: Brain: No evidence of acute infarction, hemorrhage,  hydrocephalus, extra-axial collection or mass lesion/mass effect. Vascular: No hyperdense vessel or unexpected calcification. Skull: Normal. Negative for fracture or focal lesion. Sinuses/Orbits: There is mucosal thickening involving the frontal sinus, ethmoid air cells. The remaining paranasal sinuses and mastoid air cells are clear. Other: None. IMPRESSION: 1. No acute intracranial abnormalities. 2. Chronic sinus inflammation. Electronically Signed   By: Kerby Moors M.D.   On: 12/07/2021 16:37    Procedures Procedures    Medications Ordered in ED Medications - No data to display  ED Course/ Medical Decision Making/ A&P Clinical Course as of 12/09/21 1619  Mon Dec 09, 2021  1532 Upon reassessment, patient arousable to noxious stimuli and localizing pain. Patient opens eyes to noxious stimuli. RR 18, pulse ox 98% on RA, ETO2 41. He will continue to be monitored for sobriety or for additional Narcan. EKG and accucheck within normal range [VK]  1611 Upon reassessment, patient is satting well on RA with RR 18 and ETO2 41. He opens his eyes to verbal stimuli and falls back asleep. He is having mild improvement of his mental status. Chart review shows history of multiple visits of alcohol intoxication so patient possibly has intoxication with multiple substances. He will continue to be monitored for sobriety [VK]  1618 Patient signed out to Dr Valeta Harms pending sobriety, currently in stable condition. [VK]    Clinical Course User Index [VK] Ottie Glazier, DO                           Medical Decision Making Patient is a 31 year old male presenting to the emergency department with presumed opioid overdose.  Upon patient's arrival he was breathing at an appropriate rate and satting well on room air with end-tidal O2 of 39.  He is arousable to noxious stimuli.  IV access was obtained by medics.  EKG was performed shows no acute abnormalities.  Accu-Chek will be performed. He has no external signs  of trauma, no evidence of trauma at the scene and was ambulating normally at the scene, making ICH or mass effect unlikely cause of his AMS.  He will be closely monitored for possible repeat doses of Narcan for respiratory depression.  He will additionally have labs performed for needlestick exposure.  Amount and/or Complexity of Data Reviewed External Data Reviewed: labs and notes.    Details: Patient has been seen in the ED multiple times in the past for alcohol intoxication and last visit did require Narcan as well for suspected opioid overdose with appropriate response.  Patient had labs and CT imaging performed in the ED 2 days ago that were within normal range. UDS + for cocaine and marijuana. Labs: ordered.          Final Clinical Impression(s) / ED Diagnoses Final diagnoses:  Opiate overdose, undetermined intent,  initial encounter [facility])    Rx / [place] Orders ED Discharge Orders     None         Ottie Glazier, DO 12/09/21 1619

## 2021-12-09 NOTE — ED Provider Notes (Signed)
Care assumed from Dr. Ernesto Rutherford.  Patient found down on the sidewalk after suspected opiate overdose.  Was given Narcan with good effect.  Patient has had no further need for narcan.  Observed in the ED for 3+ hours.   Labs show alcohol intoxication. He admits to 5 "airline bottles" today plus beer. Denies any drug use.   Patient is awake and alert at 8:30 PM.  He is tolerating p.o. and ambulatory.  Denies any suicidal attempt or homicidal thoughts.  He is able to tolerate p.o. and ambulate.  He denies any SI or HI.  Will defer CT head at this time.  Advised to cut back on his drinking and avoid any illicit drug use.  Resource guide given.  Return precautions discussed.     Ezequiel Essex, MD 12/09/21 2050

## 2021-12-09 NOTE — ED Triage Notes (Signed)
Fire arrived and found pt unresponsive, assisted with ventilation, gave 1 mg of narcan intranasal. Upon EMS arrival, pt ambulatory to truck and responsive, denies taking anything.   BP 154/72 HR 78 RR14 98% CBG 133

## 2021-12-09 NOTE — ED Notes (Signed)
Help get patient on the monitor did ekg shown to er provider patient is resting with nurse at bedside

## 2021-12-09 NOTE — Discharge Instructions (Addendum)
Cut back on your drinking.  Follow-up with a primary care doctor.  Return to the ED with new or worsening symptoms

## 2021-12-09 NOTE — ED Notes (Signed)
Pt ambulatory to the bathroom without difficulty.

## 2021-12-10 LAB — HCV AB W REFLEX TO QUANT PCR: HCV Ab: NONREACTIVE

## 2021-12-10 LAB — HCV INTERPRETATION

## 2021-12-23 ENCOUNTER — Other Ambulatory Visit: Payer: Self-pay

## 2021-12-23 ENCOUNTER — Emergency Department (HOSPITAL_COMMUNITY)
Admission: EM | Admit: 2021-12-23 | Discharge: 2021-12-24 | Disposition: A | Discharge status: Home / Self Care | Payer: 59 | Attending: Emergency Medicine | Admitting: Emergency Medicine

## 2021-12-23 ENCOUNTER — Encounter (HOSPITAL_COMMUNITY): Payer: Self-pay

## 2021-12-23 DIAGNOSIS — T402X1A Poisoning by other opioids, accidental (unintentional), initial encounter: Secondary | ICD-10-CM

## 2021-12-23 DIAGNOSIS — R4182 Altered mental status, unspecified: Secondary | ICD-10-CM | POA: Diagnosis present

## 2021-12-23 DIAGNOSIS — F10920 Alcohol use, unspecified with intoxication, uncomplicated: Secondary | ICD-10-CM

## 2021-12-23 LAB — CBC
HCT: 38.4 % — ABNORMAL LOW (ref 39.0–52.0)
Hemoglobin: 12.7 g/dL — ABNORMAL LOW (ref 13.0–17.0)
MCH: 32.5 pg (ref 26.0–34.0)
MCHC: 33.1 g/dL (ref 30.0–36.0)
MCV: 98.2 fL (ref 80.0–100.0)
Platelets: 248 10*3/uL (ref 150–400)
RBC: 3.91 MIL/uL — ABNORMAL LOW (ref 4.22–5.81)
RDW: 13.8 % (ref 11.5–15.5)
WBC: 4.2 10*3/uL (ref 4.0–10.5)
nRBC: 0 % (ref 0.0–0.2)

## 2021-12-23 LAB — COMPREHENSIVE METABOLIC PANEL
ALT: 23 U/L (ref 0–44)
AST: 26 U/L (ref 15–41)
Albumin: 3.9 g/dL (ref 3.5–5.0)
Alkaline Phosphatase: 45 U/L (ref 38–126)
Anion gap: 12 (ref 5–15)
BUN: 12 mg/dL (ref 6–20)
CO2: 25 mmol/L (ref 22–32)
Calcium: 8.6 mg/dL — ABNORMAL LOW (ref 8.9–10.3)
Chloride: 102 mmol/L (ref 98–111)
Creatinine, Ser: 0.92 mg/dL (ref 0.61–1.24)
GFR, Estimated: >60 mL/min (ref >60)
Glucose, Bld: 99 mg/dL (ref 70–99)
Potassium: 3.2 mmol/L — ABNORMAL LOW (ref 3.5–5.1)
Sodium: 139 mmol/L (ref 135–145)
Total Bilirubin: 0.2 mg/dL — ABNORMAL LOW (ref 0.3–1.2)
Total Protein: 6.8 g/dL (ref 6.5–8.1)

## 2021-12-23 LAB — ETHANOL: Alcohol, Ethyl (B): 234 mg/dL — ABNORMAL HIGH (ref <10)

## 2021-12-23 MED ORDER — NALOXONE HCL 2 MG/2ML IJ SOSY
1.0000 mg | PREFILLED_SYRINGE | Freq: Once | INTRAMUSCULAR | Status: AC
  Administered 2021-12-23: 1 mg via INTRAVENOUS
  Filled 2021-12-23: qty 2

## 2021-12-23 NOTE — ED Triage Notes (Signed)
Pt arrives [facility] from [place]. Police found him unresponsive. Pt woke up long enough to say he's had too much to drink today.

## 2021-12-23 NOTE — ED Provider Notes (Signed)
  [facility]-EMERGENCY DEPT Provider Note   CSN: [id number] Arrival date & time: 12/23/21  2120     History  Chief Complaint  Patient presents with   Alcohol Intoxication    David Burnett is a 31 y.o. male.   Alcohol Intoxication  Patient brought in from [employer].  Reportedly found unresponsive.  Does have another chart here with a different MRN that has been marked for merge.  Multiple visits to the ER for substance abuse and alcohol abuse.  Reportedly had woke and told staff that he had too much to drink today.  Patient cannot provide any history for me.     Home Medications Prior to Admission medications   Not on File      Allergies    Patient has no allergy information on record.    Review of Systems   Review of Systems  Physical Exam Updated Vital Signs BP 108/65   Pulse 78   Temp (!) 97.4 F (36.3 C) (Oral)   Resp 15   SpO2 100%  Physical Exam Vitals reviewed.  Eyes:     Comments: Pinpoint pupils  Cardiovascular:     Rate and Rhythm: Regular rhythm.  Pulmonary:     Breath sounds: No wheezing or rhonchi.  Abdominal:     Tenderness: There is no abdominal tenderness.  Musculoskeletal:        General: No tenderness.  Skin:    General: Skin is warm.  Neurological:     Comments: Initially minimally responsive.  Pinpoint pupils.  Minimal response to pain but is breathing spontaneously.     ED Results / Procedures / Treatments   Labs (all labs ordered are listed, but only abnormal results are displayed) Labs Reviewed  CBC - Abnormal; Notable for the following components:      Result Value   RBC 3.91 (*)    Hemoglobin 12.7 (*)    HCT 38.4 (*)    All other components within normal limits  COMPREHENSIVE METABOLIC PANEL  ETHANOL  RAPID URINE DRUG SCREEN, HOSP PERFORMED    EKG None  Radiology No results found.  Procedures Procedures    Medications Ordered in ED Medications  naloxone (NARCAN) injection 1 mg (1 mg  Intravenous Given 12/23/21 2241)    ED Course/ Medical Decision Making/ A&P                           Medical Decision Making Amount and/or Complexity of Data Reviewed Labs: ordered.  Risk Prescription drug management.   Patient brought in for mental status change.  Found unresponsive.  Reviewed records and has had previous visits for similar symptoms.  Given milligram of Narcan here with only mild relief.  Pupils still pinpoint but wakes up more with stimulation now.  Does not like a tongue depressor in his mouth and will grab for it.  Reviewed notes and alcohol levels been elevated in the past.  Has had negative drug screens, however has often woken up with Narcan.  This point we will monitor for improvement of his mental status.  If continues to improve likely not need much more workup.  Care will be turned over to Dr. Posey Rea.        Final Clinical Impression(s) / ED Diagnoses Final diagnoses:  None    Rx / DC Orders ED Discharge Orders     None         Benjiman Core, MD 12/23/21 2318

## 2021-12-24 ENCOUNTER — Encounter (HOSPITAL_COMMUNITY): Payer: Self-pay

## 2021-12-24 ENCOUNTER — Emergency Department (HOSPITAL_COMMUNITY): Payer: 59

## 2021-12-24 LAB — RAPID URINE DRUG SCREEN, HOSP PERFORMED
Amphetamines: NOT DETECTED
Barbiturates: NOT DETECTED
Benzodiazepines: NOT DETECTED
Cocaine: POSITIVE — AB
Opiates: NOT DETECTED
Tetrahydrocannabinol: POSITIVE — AB

## 2021-12-24 NOTE — ED Provider Notes (Signed)
  Physical Exam  BP (!) 98/55   Pulse 77   Temp (!) 97.4 F (36.3 C) (Oral)   Resp 13   SpO2 98%   Physical Exam Constitutional:      General: He is not in acute distress.    Appearance: Normal appearance.  HENT:     Head: Normocephalic and atraumatic.     Nose: No congestion or rhinorrhea.  Eyes:     General:        Right eye: No discharge.        Left eye: No discharge.     Extraocular Movements: Extraocular movements intact.     Pupils: Pupils are equal, round, and reactive to light.  Cardiovascular:     Rate and Rhythm: Normal rate and regular rhythm.     Heart sounds: No murmur heard. Pulmonary:     Effort: No respiratory distress.     Breath sounds: No wheezing or rales.  Abdominal:     General: There is no distension.     Tenderness: There is no abdominal tenderness.  Musculoskeletal:        General: Normal range of motion.     Cervical back: Normal range of motion.  Skin:    General: Skin is warm and dry.  Neurological:     General: No focal deficit present.     Mental Status: He is alert.     Procedures  Procedures  ED Course / MDM   Clinical Course as of 12/24/21 0601  Mon Dec 23, 2021  2347 MTF [MK]    Clinical Course User Index [MK] Teressa Lower, MD   Medical Decision Making Amount and/or Complexity of Data Reviewed Labs: ordered. Radiology: ordered.  Risk Prescription drug management.   Patient received in handoff.  Alcohol use and substance use.  Patient Narcan on previous shift.  On reevaluation, patient appears to have metabolized the substances on board and is alert and oriented answering questions appropriately.  He is able to ambulate in the emergency department without difficulty and is showing no signs of significant intoxication.  Patient clinically sober and patient discharged.  But       Teressa Lower, MD 12/24/21 [phone]

## 2022-01-04 DIAGNOSIS — Y906 Blood alcohol level of 120-199 mg/100 ml: Secondary | ICD-10-CM | POA: Diagnosis not present

## 2022-01-04 DIAGNOSIS — F10920 Alcohol use, unspecified with intoxication, uncomplicated: Secondary | ICD-10-CM | POA: Diagnosis present

## 2022-01-04 DIAGNOSIS — E876 Hypokalemia: Secondary | ICD-10-CM | POA: Diagnosis not present

## 2022-01-04 DIAGNOSIS — R4182 Altered mental status, unspecified: Secondary | ICD-10-CM | POA: Insufficient documentation

## 2022-01-05 ENCOUNTER — Emergency Department (HOSPITAL_COMMUNITY)
Admission: EM | Admit: 2022-01-05 | Discharge: 2022-01-05 | Disposition: A | Discharge status: Home / Self Care | Payer: 59 | Attending: Emergency Medicine | Admitting: Emergency Medicine

## 2022-01-05 ENCOUNTER — Emergency Department (HOSPITAL_COMMUNITY): Payer: 59

## 2022-01-05 ENCOUNTER — Encounter (HOSPITAL_COMMUNITY): Payer: Self-pay | Admitting: Student

## 2022-01-05 ENCOUNTER — Other Ambulatory Visit: Payer: Self-pay

## 2022-01-05 DIAGNOSIS — F10920 Alcohol use, unspecified with intoxication, uncomplicated: Secondary | ICD-10-CM

## 2022-01-05 LAB — CBC WITH DIFFERENTIAL/PLATELET
Abs Immature Granulocytes: 0.01 10*3/uL (ref 0.00–0.07)
Basophils Absolute: 0 10*3/uL (ref 0.0–0.1)
Basophils Relative: 1 %
Eosinophils Absolute: 0.1 10*3/uL (ref 0.0–0.5)
Eosinophils Relative: 1 %
HCT: 43.5 % (ref 39.0–52.0)
Hemoglobin: 14.9 g/dL (ref 13.0–17.0)
Immature Granulocytes: 0 %
Lymphocytes Relative: 27 %
Lymphs Abs: 1.4 10*3/uL (ref 0.7–4.0)
MCH: 33.2 pg (ref 26.0–34.0)
MCHC: 34.3 g/dL (ref 30.0–36.0)
MCV: 96.9 fL (ref 80.0–100.0)
Monocytes Absolute: 0.5 10*3/uL (ref 0.1–1.0)
Monocytes Relative: 9 %
Neutro Abs: 3.2 10*3/uL (ref 1.7–7.7)
Neutrophils Relative %: 62 %
Platelets: 292 10*3/uL (ref 150–400)
RBC: 4.49 MIL/uL (ref 4.22–5.81)
RDW: 13.5 % (ref 11.5–15.5)
WBC: 5.3 10*3/uL (ref 4.0–10.5)
nRBC: 0 % (ref 0.0–0.2)

## 2022-01-05 LAB — COMPREHENSIVE METABOLIC PANEL
ALT: 25 U/L (ref 0–44)
AST: 31 U/L (ref 15–41)
Albumin: 4.6 g/dL (ref 3.5–5.0)
Alkaline Phosphatase: 60 U/L (ref 38–126)
Anion gap: 13 (ref 5–15)
BUN: 25 mg/dL — ABNORMAL HIGH (ref 6–20)
CO2: 25 mmol/L (ref 22–32)
Calcium: 9.4 mg/dL (ref 8.9–10.3)
Chloride: 101 mmol/L (ref 98–111)
Creatinine, Ser: 0.94 mg/dL (ref 0.61–1.24)
GFR, Estimated: >60 mL/min (ref >60)
Glucose, Bld: 112 mg/dL — ABNORMAL HIGH (ref 70–99)
Potassium: 3.3 mmol/L — ABNORMAL LOW (ref 3.5–5.1)
Sodium: 139 mmol/L (ref 135–145)
Total Bilirubin: 0.3 mg/dL (ref 0.3–1.2)
Total Protein: 8 g/dL (ref 6.5–8.1)

## 2022-01-05 LAB — RAPID URINE DRUG SCREEN, HOSP PERFORMED
Amphetamines: NOT DETECTED
Barbiturates: NOT DETECTED
Benzodiazepines: NOT DETECTED
Cocaine: POSITIVE — AB
Opiates: NOT DETECTED
Tetrahydrocannabinol: POSITIVE — AB

## 2022-01-05 LAB — ACETAMINOPHEN LEVEL: Acetaminophen (Tylenol), Serum: <10 ug/mL — ABNORMAL LOW (ref 10–30)

## 2022-01-05 LAB — SALICYLATE LEVEL: Salicylate Lvl: <7 mg/dL — ABNORMAL LOW (ref 7.0–30.0)

## 2022-01-05 LAB — ETHANOL: Alcohol, Ethyl (B): 192 mg/dL — ABNORMAL HIGH (ref <10)

## 2022-01-05 MED ORDER — SODIUM CHLORIDE 0.9 % IV BOLUS
1000.0000 mL | Freq: Once | INTRAVENOUS | Status: AC
  Administered 2022-01-05: 1000 mL via INTRAVENOUS

## 2022-01-05 NOTE — ED Provider Notes (Signed)
[facility] DEPT Provider Note   CSN: [id number] Arrival date & time: 01/04/22  2356     History  Chief Complaint  Patient presents with   Alcohol Intoxication    David Burnett is a 31 y.o. male with a history of alcohol use who presents to the emergency department due to alcohol intoxication at [facility] shortly prior to arrival.  Per EMS received call that patient was intoxicated. Patient admits to alcohol use tonight, does not provide much more history, not consistently answering questions, level 5 caveat applies secondary to altered mental status.  HPI     Home Medications Prior to Admission medications   Medication Sig Start Date End Date Taking? Authorizing Provider  Multiple Vitamin (MULTIVITAMIN WITH MINERALS) TABS tablet Take 1 tablet by mouth daily. 03/21/17   Bonnielee Haff, MD  potassium chloride SA (KLOR-CON M) 20 MEQ tablet Take 1 tablet (20 mEq total) by mouth 2 (two) times daily. 08/04/21   Daleen Bo, MD  ranitidine (ZANTAC) 150 MG capsule Take 1 capsule (150 mg total) by mouth 2 (two) times daily. 03/21/17   Bonnielee Haff, MD      Allergies    Patient has no known allergies.    Review of Systems   Review of Systems  Unable to perform ROS: Mental status change    Physical Exam Updated Vital Signs BP 114/64   Pulse 84   Temp 98.9 F (37.2 C) (Oral)   Resp 14   Ht '5\' 5"'$  (1.651 m)   Wt 77 kg   SpO2 96%   BMI 28.25 kg/m  Physical Exam Vitals and nursing note reviewed.  Constitutional:      Comments: Sleeping, arouses to painful stimuli  HENT:     Head: Normocephalic and atraumatic. No raccoon eyes or Battle's sign.  Eyes:     Pupils: Pupils are equal, round, and reactive to light.  Cardiovascular:     Rate and Rhythm: Normal rate and regular rhythm.  Pulmonary:     Effort: Pulmonary effort is normal.     Breath sounds: Normal breath sounds.  Chest:     Chest wall: No tenderness.  Abdominal:     Palpations:  Abdomen is soft.     Tenderness: There is no abdominal tenderness. There is no guarding.  Musculoskeletal:     Cervical back: Neck supple.  Neurological:     Comments: Sleeping. Arouses to painful stimuli. Slurred speech. Not following commands.      ED Results / Procedures / Treatments   Labs (all labs ordered are listed, but only abnormal results are displayed) Labs Reviewed  COMPREHENSIVE METABOLIC PANEL - Abnormal; Notable for the following components:      Result Value   Potassium 3.3 (*)    Glucose, Bld 112 (*)    BUN 25 (*)    All other components within normal limits  ETHANOL - Abnormal; Notable for the following components:   Alcohol, Ethyl (B) 192 (*)    All other components within normal limits  CBC WITH DIFFERENTIAL/PLATELET  RAPID URINE DRUG SCREEN, HOSP PERFORMED    EKG None  Radiology CT Head Wo Contrast  Result Date: 01/05/2022 CLINICAL DATA:  Head trauma, abnormal mental status. EXAM: CT HEAD WITHOUT CONTRAST TECHNIQUE: Contiguous axial images were obtained from the base of the skull through the vertex without intravenous contrast. RADIATION DOSE REDUCTION: This exam was performed according to the departmental dose-optimization program which includes automated exposure control, adjustment of the mA and/or kV  according to patient size and/or use of iterative reconstruction technique. COMPARISON:  12/24/2021. FINDINGS: Brain: No acute intracranial hemorrhage, midline shift or mass effect. No extra-axial fluid collection. Gray-white matter differentiation is within normal limits. No hydrocephalus. Vascular: No hyperdense vessel or unexpected calcification. Skull: Normal. Negative for fracture or focal lesion. Sinuses/Orbits: Mucosal thickening is present in the left maxillary sinus and ethmoid air cells bilaterally. No acute orbital abnormality. Other: There is bony deformity of the lamina papyracea in the medial wall of the left orbit, unchanged from the prior exam  likely related to old fracture. There is an old fracture of the left nasal bone. IMPRESSION: No acute intracranial process. Electronically Signed   By: Brett Fairy M.D.   On: 01/05/2022 04:25    Procedures Procedures    Medications Ordered in ED Medications  sodium chloride 0.9 % bolus 1,000 mL (0 mLs Intravenous Stopped 01/05/22 0225)    ED Course/ Medical Decision Making/ A&P                           Medical Decision Making Amount and/or Complexity of Data Reviewed Labs: ordered. Radiology: ordered.   Patient presents to the emergency department via EMS from [employer] secondary to alcohol intoxication.  On arrival patient is altered, not consistently answering questions or following commands.  No physical evidence of trauma.  Will check labs and observe.  I viewed prior ED visits as well as prior labs and CT imaging.  Ordered, viewed, interpreted labs including CBC, CMP, ethanol level: Acutely intoxicated, mild hypokalemia and elevation in BUN.  No critical electrolyte derangement.  03:20: RE-EVAL: Patient arousable to voice, remains with slurred speech, continues to admit alcohol use, does admit drug use but does not elaborate.  Speaking nonsensically at times.  Given prolonged altered mental status will check head CT as well as acetaminophen salicylate levels.  Acetaminophen and salicylate levels are undetectable.  I ordered, viewed & interpreted CT head-  No acute intracranial process.   06:30: Patient awake, ambulatory, tolerating PO, feels ready to go home which appears appropriate at this time.   We discussed results, treatment plan, need for follow-up, and return precautions with the patient. Provided opportunity for questions, patient confirmed understanding and is in agreement with plan.   Final Clinical Impression(s) / ED Diagnoses Final diagnoses:  Alcoholic intoxication without complication [facility])    Rx / DC Orders ED Discharge Orders     None          Amaryllis Dyke, PA-C 01/05/22 0640    Maudie Flakes, MD 01/05/22 [phone]

## 2022-01-05 NOTE — ED Notes (Signed)
Ambulated to restroom and back to bed with steady gait, now eating and drinking

## 2022-01-05 NOTE — ED Triage Notes (Signed)
BIBA from [employer] for alcohol intoxication, has a hx

## 2022-01-14 ENCOUNTER — Other Ambulatory Visit: Payer: Self-pay

## 2022-01-14 ENCOUNTER — Emergency Department (HOSPITAL_COMMUNITY)
Admission: EM | Admit: 2022-01-14 | Discharge: 2022-01-14 | Disposition: A | Discharge status: Home / Self Care | Payer: 59 | Attending: Emergency Medicine | Admitting: Emergency Medicine

## 2022-01-14 ENCOUNTER — Encounter (HOSPITAL_COMMUNITY): Payer: Self-pay

## 2022-01-14 DIAGNOSIS — Y906 Blood alcohol level of 120-199 mg/100 ml: Secondary | ICD-10-CM | POA: Diagnosis not present

## 2022-01-14 DIAGNOSIS — F10129 Alcohol abuse with intoxication, unspecified: Secondary | ICD-10-CM | POA: Insufficient documentation

## 2022-01-14 DIAGNOSIS — F10920 Alcohol use, unspecified with intoxication, uncomplicated: Secondary | ICD-10-CM

## 2022-01-14 DIAGNOSIS — R7309 Other abnormal glucose: Secondary | ICD-10-CM | POA: Diagnosis not present

## 2022-01-14 LAB — BASIC METABOLIC PANEL
Anion gap: 9 (ref 5–15)
BUN: 14 mg/dL (ref 6–20)
CO2: 27 mmol/L (ref 22–32)
Calcium: 9 mg/dL (ref 8.9–10.3)
Chloride: 102 mmol/L (ref 98–111)
Creatinine, Ser: 1.21 mg/dL (ref 0.61–1.24)
GFR, Estimated: >60 mL/min (ref >60)
Glucose, Bld: 108 mg/dL — ABNORMAL HIGH (ref 70–99)
Potassium: 3.5 mmol/L (ref 3.5–5.1)
Sodium: 138 mmol/L (ref 135–145)

## 2022-01-14 LAB — CBC WITH DIFFERENTIAL/PLATELET
Abs Immature Granulocytes: 0.01 10*3/uL (ref 0.00–0.07)
Basophils Absolute: 0 10*3/uL (ref 0.0–0.1)
Basophils Relative: 1 %
Eosinophils Absolute: 0.1 10*3/uL (ref 0.0–0.5)
Eosinophils Relative: 1 %
HCT: 41.8 % (ref 39.0–52.0)
Hemoglobin: 14.1 g/dL (ref 13.0–17.0)
Immature Granulocytes: 0 %
Lymphocytes Relative: 37 %
Lymphs Abs: 1.6 10*3/uL (ref 0.7–4.0)
MCH: 32.7 pg (ref 26.0–34.0)
MCHC: 33.7 g/dL (ref 30.0–36.0)
MCV: 97 fL (ref 80.0–100.0)
Monocytes Absolute: 0.4 10*3/uL (ref 0.1–1.0)
Monocytes Relative: 9 %
Neutro Abs: 2.1 10*3/uL (ref 1.7–7.7)
Neutrophils Relative %: 52 %
Platelets: 280 10*3/uL (ref 150–400)
RBC: 4.31 MIL/uL (ref 4.22–5.81)
RDW: 13.5 % (ref 11.5–15.5)
WBC: 4.1 10*3/uL (ref 4.0–10.5)
nRBC: 0 % (ref 0.0–0.2)

## 2022-01-14 LAB — ACETAMINOPHEN LEVEL: Acetaminophen (Tylenol), Serum: <10 ug/mL — ABNORMAL LOW (ref 10–30)

## 2022-01-14 LAB — ETHANOL: Alcohol, Ethyl (B): 122 mg/dL — ABNORMAL HIGH (ref <10)

## 2022-01-14 LAB — SALICYLATE LEVEL: Salicylate Lvl: <7 mg/dL — ABNORMAL LOW (ref 7.0–30.0)

## 2022-01-14 MED ORDER — NALOXONE HCL 0.4 MG/ML IJ SOLN: 0.4000 mg | Freq: Once | INTRAMUSCULAR | Status: DC

## 2022-01-14 MED ORDER — THIAMINE MONONITRATE 100 MG PO TABS: 100.0000 mg | ORAL_TABLET | Freq: Every day | ORAL | Status: DC

## 2022-01-14 MED ORDER — THIAMINE HCL 100 MG/ML IJ SOLN: 100.0000 mg | Freq: Once | INTRAMUSCULAR | Status: DC

## 2022-01-14 MED ORDER — THIAMINE MONONITRATE 100 MG PO TABS: 100.0000 mg | ORAL_TABLET | Freq: Once | ORAL | Status: DC

## 2022-01-14 NOTE — ED Triage Notes (Signed)
[place]. Pt was found laying on sidewalk asleep after going into gas station and asking for help. Pt endorses drinking a lot of liquor today. No obvious injuries.

## 2022-01-14 NOTE — ED Notes (Signed)
Upon returning to exam room, pt could not be located in room. Pt could not be located within ED.

## 2022-01-14 NOTE — ED Provider Notes (Signed)
[facility] DEPT Provider Note   CSN: [id number] Arrival date & time: 01/14/22  1439     History  Chief Complaint  Patient presents with   Alcohol Intoxication    David Burnett is a 31 y.o. male.  Well-known to this ER for multiple presentations related to alcohol intoxication BIB EMS from a gas station after going into the gas station asking for help and endorsing drinking  a lot of liquor.  To me, he tells me he had "4, 5, 6 whiskey bottles."  He says they are only a dollar apiece.  He denies having any pain anywhere or hurting himself.  He denies any thoughts of suicidal ideation, homicidal ideation or hallucinations or attempting to harm himself.  He denies any drug use.  He says he went in needing help because he was intoxicated and needs more sleep.   Alcohol Intoxication       Home Medications Prior to Admission medications   Medication Sig Start Date End Date Taking? Authorizing Provider  Multiple Vitamin (MULTIVITAMIN WITH MINERALS) TABS tablet Take 1 tablet by mouth daily. 03/21/17   David Haff, MD  potassium chloride SA (KLOR-CON M) 20 MEQ tablet Take 1 tablet (20 mEq total) by mouth 2 (two) times daily. 08/04/21   David Bo, MD  ranitidine (ZANTAC) 150 MG capsule Take 1 capsule (150 mg total) by mouth 2 (two) times daily. 03/21/17   David Haff, MD      Allergies    Patient has no known allergies.    Review of Systems   Review of Systems  Physical Exam Updated Vital Signs BP 116/75 (BP Location: Left Arm)   Pulse 82   Temp 97.6 F (36.4 C) (Oral)   Resp 14   SpO2 96%  Physical Exam Constitutional: Sleeping and visibly intoxicated but will easily awaken to voice, follow commands, answer questions Eyes: PE2RRL, EOMI ENT      Head: Normocephalic and atraumatic.      Nose: No congestion.      Mouth/Throat: Mucous membranes are moist.      Neck: No stridor. Cardiovascular: S1, S2,  Normal and symmetric distal pulses  are present in all extremities.Warm and well perfused.  No chest wall crepitus or deformity Respiratory: Normal respiratory effort. Breath sounds are normal.  O2 sat 96 on RA Gastrointestinal: Soft and nontender.  Musculoskeletal: Normal range of motion in all extremities.      Right lower leg: No tenderness or edema.      Left lower leg: No tenderness or edema. Neurologic: Intoxicated visibly but will awaken to voice, follow commands, moving all extremities equally Skin: Skin is warm, dry and intact. No rash noted. Psychiatric: Intoxicated  ED Results / Procedures / Treatments   Labs (all labs ordered are listed, but only abnormal results are displayed) Labs Reviewed  BASIC METABOLIC PANEL - Abnormal; Notable for the following components:      Result Value   Glucose, Bld 108 (*)    All other components within normal limits  SALICYLATE LEVEL - Abnormal; Notable for the following components:   Salicylate Lvl <4.0 (*)    All other components within normal limits  ACETAMINOPHEN LEVEL - Abnormal; Notable for the following components:   Acetaminophen (Tylenol), Serum <10 (*)    All other components within normal limits  ETHANOL - Abnormal; Notable for the following components:   Alcohol, Ethyl (B) 122 (*)    All other components within normal limits  CBC WITH DIFFERENTIAL/PLATELET  EKG None  Radiology No results found.  Procedures Procedures  Remained on constant cardiac monitoring normal sinus rhythm  Medications Ordered in ED Medications  thiamine (VITAMIN B1) tablet 100 mg (has no administration in time range)    ED Course/ Medical Decision Making/ A&P Clinical Course as of 01/14/22 1928  Tue Jan 14, 2022  1927 Patient has been reevaluated and now he is clinically sober and ambulating with a steady gait.  He denies any SI/HI/AVH.  He has no deficits or concerning findings, no requirement for head CT.  Discussed never drinking alcohol and driving and provided resources.   Safe for discharge. [VB]    Clinical Course User Index [VB] David Congo, MD                           Medical Decision Making David Burnett is a 31 y.o. male.  Well-known to this ER for multiple presentations related to alcohol intoxication BIB EMS from a gas station after going into the gas station asking for help and endorsing drinking  a lot of liquor.   Patient presents visibly intoxicated and endorsing drinking liquor earlier today.  He is very well-known to this ED with multiple visits for substance use and intoxication.  Clinically, patient smells of alcohol, has mildly slurred speech and endorses drinking liquor and has a presentation consistent with alcohol intoxication.  There is no evidence of head trauma on exam and he is moving all extremities equally, low suspicion for ICH or intracranial abnormality at this time requiring head imaging.  He had smaller but not pinpoint pupils and no bradypnea, no hypoxia. Will continue to monitor and give narcan if necessary.   Basic labs were checked which showed glucose 108 sodium 138. Normal Cr. Alcohol level was 122.  He was given thiamine for chronic alcohol use.  No concern for Wernicke's or Korsakoff at this time.  Patient was observed on constant cardiac monitoring for 4.5 hours until he was clinically sober, ambulating with steady gait and answering questions appropriately.  He is denying SI/HI/AVH.  He is safe to be discharged back to community.  Amount and/or Complexity of Data Reviewed Labs: ordered. Decision-making details documented in ED Course. ECG/medicine tests: independent interpretation performed. Decision-making details documented in ED Course.  Risk OTC drugs. Prescription drug management.    Final Clinical Impression(s) / ED Diagnoses Final diagnoses:  Alcoholic intoxication without complication [facility])    Rx / DC Orders ED Discharge Orders     None         David Congo, MD 01/14/22  1928

## 2022-01-14 NOTE — Discharge Instructions (Signed)
You were seen in the Emergency Department today for alcohol intoxication.  Do not drink alcohol to excess and NEVER drive after drinking alcohol.   [phone] Fax   Please follow up with your primary doctor regarding todays ED visit.  Do not drive today.  Drink plenty of water or other clear liquids (Gatorade, Powerade, etc).  Return to the ED for any apparent emergent condition or symptoms that concern you.  Acute Alcohol Intoxication: After Your Visit  Your Care Instructions  You have had treatment to help your body rid itself of alcohol. Too much alcohol upsets the body's fluid balance. Your doctor may have given you fluids and vitamins.  For some people, drinking too much alcohol is a one-time event. For others, it is an ongoing problem. In either case, it is serious. It can be life-threatening.  Follow-up care is a key part of your treatment and safety. Be sure to make and go to all appointments, and call your doctor if you are having problems. It's also a good idea to know your test results and keep a list of the medicines you take.  How can you care for yourself at home?  Be safe with medicines. Take your medicines exactly as prescribed. Call your doctor if you think you are having a problem with your medicine.  If you were given medicine to prevent nausea, be sure to take it exactly as prescribed.  Before you take any medicine, tell your doctor if:  You have had a bad reaction to any medicines in the past.  You are taking other medicines, including over-the-counter ones, or have other health problems.  You are or could be pregnant. Be prepared to have some symptoms of withdrawal in the next few days.  Drink plenty of liquids in the next few days.  Seek help if you need it to stop drinking. Getting counseling and joining a support group can help you stay sober. Try a support group such as Alcoholics Anonymous.  Avoid alcohol when you take medicines. It can react with many medicines and  cause serious problems. When should you call for help?  Call 911 anytime you think you may need emergency care. For example, call if:  You feel confused and are seeing things that are not there.  You are thinking about killing yourself or hurting others.  You have a seizure.  You vomit blood or what looks like coffee grounds. Call your doctor now or seek immediate medical care if:  You have trembling, restlessness, sweating, and other withdrawal symptoms that are new or that get worse.  Your withdrawal symptoms come back after not bothering you for days or weeks.  You can't stop vomiting. Watch closely for changes in your health, and be sure to contact your doctor if:  You need help to stop drinking.

## 2022-01-15 ENCOUNTER — Emergency Department (HOSPITAL_COMMUNITY)
Admission: EM | Admit: 2022-01-15 | Discharge: 2022-01-16 | Discharge status: Against Medical Advice | Payer: 59 | Attending: Emergency Medicine | Admitting: Emergency Medicine

## 2022-01-15 ENCOUNTER — Emergency Department (HOSPITAL_COMMUNITY): Payer: 59

## 2022-01-15 ENCOUNTER — Other Ambulatory Visit: Payer: Self-pay

## 2022-01-15 ENCOUNTER — Encounter (HOSPITAL_COMMUNITY): Payer: Self-pay | Admitting: Emergency Medicine

## 2022-01-15 DIAGNOSIS — F10129 Alcohol abuse with intoxication, unspecified: Secondary | ICD-10-CM

## 2022-01-15 DIAGNOSIS — T40601A Poisoning by unspecified narcotics, accidental (unintentional), initial encounter: Secondary | ICD-10-CM

## 2022-01-15 DIAGNOSIS — R4182 Altered mental status, unspecified: Secondary | ICD-10-CM | POA: Diagnosis not present

## 2022-01-15 DIAGNOSIS — T402X1A Poisoning by other opioids, accidental (unintentional), initial encounter: Secondary | ICD-10-CM | POA: Insufficient documentation

## 2022-01-15 DIAGNOSIS — R464 Slowness and poor responsiveness: Secondary | ICD-10-CM | POA: Diagnosis not present

## 2022-01-15 DIAGNOSIS — Y907 Blood alcohol level of 200-239 mg/100 ml: Secondary | ICD-10-CM | POA: Diagnosis not present

## 2022-01-15 LAB — CBC WITH DIFFERENTIAL/PLATELET
Abs Immature Granulocytes: 0 10*3/uL (ref 0.00–0.07)
Basophils Absolute: 0 10*3/uL (ref 0.0–0.1)
Basophils Relative: 1 %
Eosinophils Absolute: 0.1 10*3/uL (ref 0.0–0.5)
Eosinophils Relative: 3 %
HCT: 40.9 % (ref 39.0–52.0)
Hemoglobin: 14 g/dL (ref 13.0–17.0)
Immature Granulocytes: 0 %
Lymphocytes Relative: 39 %
Lymphs Abs: 1.7 10*3/uL (ref 0.7–4.0)
MCH: 33.1 pg (ref 26.0–34.0)
MCHC: 34.2 g/dL (ref 30.0–36.0)
MCV: 96.7 fL (ref 80.0–100.0)
Monocytes Absolute: 0.4 10*3/uL (ref 0.1–1.0)
Monocytes Relative: 9 %
Neutro Abs: 2.1 10*3/uL (ref 1.7–7.7)
Neutrophils Relative %: 48 %
Platelets: 283 10*3/uL (ref 150–400)
RBC: 4.23 MIL/uL (ref 4.22–5.81)
RDW: 13.5 % (ref 11.5–15.5)
WBC: 4.3 10*3/uL (ref 4.0–10.5)
nRBC: 0 % (ref 0.0–0.2)

## 2022-01-15 LAB — COMPREHENSIVE METABOLIC PANEL
ALT: 24 U/L (ref 0–44)
AST: 29 U/L (ref 15–41)
Albumin: 3.9 g/dL (ref 3.5–5.0)
Alkaline Phosphatase: 57 U/L (ref 38–126)
Anion gap: 15 (ref 5–15)
BUN: 12 mg/dL (ref 6–20)
CO2: 22 mmol/L (ref 22–32)
Calcium: 9.5 mg/dL (ref 8.9–10.3)
Chloride: 105 mmol/L (ref 98–111)
Creatinine, Ser: 1.23 mg/dL (ref 0.61–1.24)
GFR, Estimated: >60 mL/min (ref >60)
Glucose, Bld: 122 mg/dL — ABNORMAL HIGH (ref 70–99)
Potassium: 3.5 mmol/L (ref 3.5–5.1)
Sodium: 142 mmol/L (ref 135–145)
Total Bilirubin: 0.2 mg/dL — ABNORMAL LOW (ref 0.3–1.2)
Total Protein: 6.6 g/dL (ref 6.5–8.1)

## 2022-01-15 LAB — RAPID URINE DRUG SCREEN, HOSP PERFORMED
Amphetamines: NOT DETECTED
Barbiturates: NOT DETECTED
Benzodiazepines: NOT DETECTED
Cocaine: NOT DETECTED
Opiates: NOT DETECTED
Tetrahydrocannabinol: POSITIVE — AB

## 2022-01-15 LAB — ACETAMINOPHEN LEVEL: Acetaminophen (Tylenol), Serum: <10 ug/mL — ABNORMAL LOW (ref 10–30)

## 2022-01-15 LAB — ETHANOL: Alcohol, Ethyl (B): 226 mg/dL — ABNORMAL HIGH (ref <10)

## 2022-01-15 MED ORDER — LACTATED RINGERS IV BOLUS
1000.0000 mL | Freq: Once | INTRAVENOUS | Status: AC
  Administered 2022-01-15: 1000 mL via INTRAVENOUS

## 2022-01-15 MED ORDER — NALOXONE HCL 0.4 MG/ML IJ SOLN
0.4000 mg | Freq: Once | INTRAMUSCULAR | Status: AC
  Administered 2022-01-15: 0.4 mg via INTRAVENOUS
  Filled 2022-01-15: qty 1

## 2022-01-15 NOTE — ED Notes (Signed)
The pt sleeps for awhile and stays awake for awhile

## 2022-01-15 NOTE — ED Notes (Signed)
Sleeps unless hes disturbed

## 2022-01-15 NOTE — ED Triage Notes (Signed)
Pt found down in grass unresponsive with decreased respiratory effort and snoring respirations. 1mg  narcan given intranasally by fire. On ems arrival, pt was breathing spontaneously and responding to painful stimuli.   BP 110/60, Spo2 98% Ra,  CBG 160

## 2022-01-15 NOTE — ED Provider Notes (Signed)
[facility] EMERGENCY DEPARTMENT Provider Note   CSN: [id number] Arrival date & time: 01/15/22  1510     History  Chief Complaint  Patient presents with   Drug Overdose    David Burnett is a 31 y.o. male.  HPI     31 year old male with a history of alcohol abuse and dependence with frequent emergency department visits (under name/marked for merge) who presents with concern for altered mental status-was found in the grass unresponsive with decreased respirations and was given 1 mg of intranasal Narcan with improvement, as well as patient reporting alcohol intoxication.  He is intoxicated at time of my history, and somewhat difficult to understand, but reports that he woke up and has been drinking alcohol.  He denies any other known drug use.  Denies any suicidal ideation.  Denies any other recent illness or fall.  History reviewed. No pertinent past medical history.   Home Medications Prior to Admission medications   Medication Sig Start Date End Date Taking? Authorizing Provider  Multiple Vitamin (MULTIVITAMIN WITH MINERALS) TABS tablet Take 1 tablet by mouth daily. 03/21/17   Bonnielee Haff, MD  potassium chloride SA (KLOR-CON M) 20 MEQ tablet Take 1 tablet (20 mEq total) by mouth 2 (two) times daily. 08/04/21   Daleen Bo, MD  ranitidine (ZANTAC) 150 MG capsule Take 1 capsule (150 mg total) by mouth 2 (two) times daily. 03/21/17   Bonnielee Haff, MD      Allergies    Patient has no known allergies.    Review of Systems   Review of Systems  Physical Exam Updated Vital Signs BP (!) 121/93   Pulse 75   Temp 98 F (36.7 C)   Resp (!) 31   Ht '5\' 10"'$  (1.778 m)   Wt 72.6 kg   SpO2 99%   BMI 22.96 kg/m  Physical Exam Vitals and nursing note reviewed.  Constitutional:      General: He is not in acute distress.    Appearance: He is well-developed. He is not diaphoretic.  HENT:     Head: Normocephalic and atraumatic.  Eyes:      Conjunctiva/sclera: Conjunctivae normal.  Cardiovascular:     Rate and Rhythm: Normal rate and regular rhythm.     Heart sounds: Normal heart sounds. No murmur heard.    No friction rub. No gallop.  Pulmonary:     Effort: Pulmonary effort is normal. No respiratory distress.     Breath sounds: Normal breath sounds. No wheezing or rales.  Abdominal:     General: There is no distension.     Palpations: Abdomen is soft.     Tenderness: There is no abdominal tenderness. There is no guarding.  Musculoskeletal:     Cervical back: Normal range of motion.  Skin:    General: Skin is warm and dry.  Neurological:     Mental Status: He is alert and oriented to person, place, and time.     ED Results / Procedures / Treatments   Labs (all labs ordered are listed, but only abnormal results are displayed) Labs Reviewed  COMPREHENSIVE METABOLIC PANEL - Abnormal; Notable for the following components:      Result Value   Glucose, Bld 122 (*)    Total Bilirubin 0.2 (*)    All other components within normal limits  ETHANOL - Abnormal; Notable for the following components:   Alcohol, Ethyl (B) 226 (*)    All other components within normal limits  ACETAMINOPHEN  LEVEL - Abnormal; Notable for the following components:   Acetaminophen (Tylenol), Serum <10 (*)    All other components within normal limits  RAPID URINE DRUG SCREEN, HOSP PERFORMED - Abnormal; Notable for the following components:   Tetrahydrocannabinol POSITIVE (*)    All other components within normal limits  CBC WITH DIFFERENTIAL/PLATELET    EKG EKG Interpretation  Date/Time:  Wednesday January 15 2022 15:56:47 EDT Ventricular Rate:  92 PR Interval:  167 QRS Duration: 94 QT Interval:  337 QTC Calculation: 417 R Axis:   80 Text Interpretation: Sinus rhythm ST elev, probable normal early repol pattern No previous ECGs available Confirmed by Gareth Morgan [phone]) on 01/15/2022 4:54:44 PM  Radiology CT Head Wo  Contrast  Result Date: 01/15/2022 CLINICAL DATA:  Patient found down on grass unresponsive. With decreased respiratory effort and snoring. EXAM: CT HEAD WITHOUT CONTRAST TECHNIQUE: Contiguous axial images were obtained from the base of the skull through the vertex without intravenous contrast. RADIATION DOSE REDUCTION: This exam was performed according to the departmental dose-optimization program which includes automated exposure control, adjustment of the mA and/or kV according to patient size and/or use of iterative reconstruction technique. COMPARISON:  CT examination dated January 05, 2022 FINDINGS: Brain: No evidence of acute infarction, hemorrhage, hydrocephalus, extra-axial collection or mass lesion/mass effect. Vascular: No hyperdense vessel or unexpected calcification. Skull: Normal. Negative for fracture or focal lesion. Sinuses/Orbits: Mucosal thickening of the left maxillary sinus, unchanged. Other: None. IMPRESSION: 1. No acute intracranial abnormality. 2. Paranasal sinus disease, likely a chronic process. Electronically Signed   By: Keane Police D.O.   On: 01/15/2022 23:15    Procedures Procedures    Medications Ordered in ED Medications  lactated ringers bolus 1,000 mL (0 mLs Intravenous Stopped 01/15/22 1844)  naloxone (NARCAN) injection 0.4 mg (0.4 mg Intravenous Given 01/15/22 1638)    ED Course/ Medical Decision Making/ A&P                            31 year old male with a history of alcohol abuse and dependence with frequent emergency department visits (under name/marked for merge) who presents with concern for altered mental status-was found in the grass unresponsive with decreased respirations and was given 1 mg of intranasal Narcan with improvement, as well as patient reporting alcohol intoxication.  On arrival to the emergency department, is sleepy and intoxicated however answering questions with slurred speech and difficult to understand.  During observation became more  difficult to wake, and was given .'4mg'$  narcan with mild improvement.  Suspect polysubstance related mental status changes.  Labs completed and personally evaluated interpreted by me show an alcohol level of 226, negative acetaminophen level, no significant electrolyte abnormalities, no anemia or leukocytosis. EKG personally evaluated by me shows likely benign early repolarization, no previous ECG under this name. CT head completed given altered mental status personally interpreted by me shows no significant abnormalities.  No fever, do not suspect infection/meningitis/encephalitis.  Is not exhibiting signs of withdrawal.   Denies SI/HI.  Does report he has an inner "prayer voice" that tells him to do things and it is not clear whether this represents auditory hallucinations or more of inner voice.  Clinically sober after hours of observation.  No sign of withdrawal at this time. Does show flight of ideas, tangential speech, and is talking about his prayer voice.  Offered evaluation by psychiatry given multiple ED presentations with intoxication, and evaluation, discussion of "prayer voice." However  he declines. Do not see indication to IVC at this time.  Given resources. Patient discharged in stable condition with understanding of reasons to return.         Final Clinical Impression(s) / ED Diagnoses Final diagnoses:  Alcohol abuse with intoxication ([place])  Opiate overdose, accidental or unintentional, initial encounter [facility])    Rx / DC Orders ED Discharge Orders     None         Gareth Morgan, MD 01/17/22 1359

## 2022-01-16 ENCOUNTER — Encounter (HOSPITAL_COMMUNITY): Payer: Self-pay

## 2022-01-23 ENCOUNTER — Emergency Department (HOSPITAL_BASED_OUTPATIENT_CLINIC_OR_DEPARTMENT_OTHER)
Admission: EM | Admit: 2022-01-23 | Discharge: 2022-01-23 | Disposition: A | Discharge status: Home / Self Care | Payer: 59 | Attending: Emergency Medicine | Admitting: Emergency Medicine

## 2022-01-23 ENCOUNTER — Other Ambulatory Visit: Payer: Self-pay

## 2022-01-23 ENCOUNTER — Encounter (HOSPITAL_BASED_OUTPATIENT_CLINIC_OR_DEPARTMENT_OTHER): Payer: Self-pay | Admitting: *Deleted

## 2022-01-23 DIAGNOSIS — R400 Somnolence: Secondary | ICD-10-CM | POA: Diagnosis not present

## 2022-01-23 DIAGNOSIS — F10920 Alcohol use, unspecified with intoxication, uncomplicated: Secondary | ICD-10-CM

## 2022-01-23 DIAGNOSIS — R780 Finding of alcohol in blood: Secondary | ICD-10-CM | POA: Insufficient documentation

## 2022-01-23 DIAGNOSIS — T40601A Poisoning by unspecified narcotics, accidental (unintentional), initial encounter: Secondary | ICD-10-CM | POA: Diagnosis present

## 2022-01-23 DIAGNOSIS — T402X1A Poisoning by other opioids, accidental (unintentional), initial encounter: Secondary | ICD-10-CM

## 2022-01-23 DIAGNOSIS — E876 Hypokalemia: Secondary | ICD-10-CM

## 2022-01-23 LAB — COMPREHENSIVE METABOLIC PANEL
ALT: 21 U/L (ref 0–44)
AST: 29 U/L (ref 15–41)
Albumin: 4.4 g/dL (ref 3.5–5.0)
Alkaline Phosphatase: 60 U/L (ref 38–126)
Anion gap: 13 (ref 5–15)
BUN: 15 mg/dL (ref 6–20)
CO2: 26 mmol/L (ref 22–32)
Calcium: 10.4 mg/dL — ABNORMAL HIGH (ref 8.9–10.3)
Chloride: 103 mmol/L (ref 98–111)
Creatinine, Ser: 1 mg/dL (ref 0.61–1.24)
GFR, Estimated: >60 mL/min (ref >60)
Glucose, Bld: 117 mg/dL — ABNORMAL HIGH (ref 70–99)
Potassium: 3.3 mmol/L — ABNORMAL LOW (ref 3.5–5.1)
Sodium: 142 mmol/L (ref 135–145)
Total Bilirubin: 0.2 mg/dL — ABNORMAL LOW (ref 0.3–1.2)
Total Protein: 7.2 g/dL (ref 6.5–8.1)

## 2022-01-23 LAB — ETHANOL: Alcohol, Ethyl (B): 208 mg/dL — ABNORMAL HIGH (ref <10)

## 2022-01-23 LAB — CBC
HCT: 41.4 % (ref 39.0–52.0)
Hemoglobin: 14.3 g/dL (ref 13.0–17.0)
MCH: 32.9 pg (ref 26.0–34.0)
MCHC: 34.5 g/dL (ref 30.0–36.0)
MCV: 95.2 fL (ref 80.0–100.0)
Platelets: 318 10*3/uL (ref 150–400)
RBC: 4.35 MIL/uL (ref 4.22–5.81)
RDW: 13.8 % (ref 11.5–15.5)
WBC: 4.4 10*3/uL (ref 4.0–10.5)
nRBC: 0 % (ref 0.0–0.2)

## 2022-01-23 LAB — CBG MONITORING, ED: Glucose-Capillary: 108 mg/dL — ABNORMAL HIGH (ref 70–99)

## 2022-01-23 LAB — ACETAMINOPHEN LEVEL: Acetaminophen (Tylenol), Serum: <10 ug/mL — ABNORMAL LOW (ref 10–30)

## 2022-01-23 LAB — SALICYLATE LEVEL: Salicylate Lvl: <7 mg/dL — ABNORMAL LOW (ref 7.0–30.0)

## 2022-01-23 NOTE — ED Provider Notes (Signed)
[facility] Provider Note   CSN: [id number] Arrival date & time: 01/23/22  1347     History  Chief Complaint  Patient presents with   Drug Overdose    David Burnett is a 31 y.o. male presenting to the ED by EMS concern for possible drug overdose.  EMS provides history and reports they were called to the scene when a bystander found the patient unresponsive on the sidewalk.  Fire rescue arrived first and administered Narcan and the patient was awake at the time EMS arrived.  He was groggy but able to respond to them.  He reported that he drank "9 bottles of liquor" to EMS.  He did not require any further Narcan in route to the hospital.  However on arrival the patient does appear quite lethargic will not answer any of my other questions.  He grunts "yes" when asked if he drank today.  He will not respond when asked if he took any narcotic medicines.  Medical chart review shows the patient has had frequent ED visits for acute alcohol intoxication including a recent visit in July for opiate overdose unintentional.  PDMP does not show any prescribed narcotics per my review  HPI     Home Medications Prior to Admission medications   Medication Sig Start Date End Date Taking? Authorizing Provider  Multiple Vitamin (MULTIVITAMIN WITH MINERALS) TABS tablet Take 1 tablet by mouth daily. 03/21/17   Bonnielee Haff, MD  potassium chloride SA (KLOR-CON M) 20 MEQ tablet Take 1 tablet (20 mEq total) by mouth 2 (two) times daily. 08/04/21   Daleen Bo, MD  ranitidine (ZANTAC) 150 MG capsule Take 1 capsule (150 mg total) by mouth 2 (two) times daily. 03/21/17   Bonnielee Haff, MD      Allergies    Patient has no known allergies.    Review of Systems   Review of Systems  Physical Exam Updated Vital Signs BP 112/64   Pulse 93   Temp 98.3 F (36.8 C) (Oral)   Resp 17   SpO2 95%  Physical Exam Constitutional:      Comments: Somnolent  HENT:     Head:  Normocephalic and atraumatic.  Eyes:     Comments: Injected sclera bilaterally, pinpoint pupils  Cardiovascular:     Rate and Rhythm: Normal rate and regular rhythm.  Pulmonary:     Effort: Pulmonary effort is normal. No respiratory distress.  Abdominal:     General: There is no distension.     Tenderness: There is no abdominal tenderness.  Skin:    General: Skin is warm and dry.  Neurological:     Comments: Patient arouses to voice, will not converse.  Moving all extremities.     ED Results / Procedures / Treatments   Labs (all labs ordered are listed, but only abnormal results are displayed) Labs Reviewed  COMPREHENSIVE METABOLIC PANEL - Abnormal; Notable for the following components:      Result Value   Potassium 3.3 (*)    Glucose, Bld 117 (*)    Calcium 10.4 (*)    Total Bilirubin 0.2 (*)    All other components within normal limits  ETHANOL - Abnormal; Notable for the following components:   Alcohol, Ethyl (B) 208 (*)    All other components within normal limits  SALICYLATE LEVEL - Abnormal; Notable for the following components:   Salicylate Lvl <5.7 (*)    All other components within normal limits  ACETAMINOPHEN LEVEL - Abnormal;  Notable for the following components:   Acetaminophen (Tylenol), Serum <10 (*)    All other components within normal limits  CBG MONITORING, ED - Abnormal; Notable for the following components:   Glucose-Capillary 108 (*)    All other components within normal limits  CBC  RAPID URINE DRUG SCREEN, HOSP PERFORMED    EKG None  Radiology No results found.  Procedures Procedures    Medications Ordered in ED Medications - No data to display  ED Course/ Medical Decision Making/ A&P Clinical Course as of 01/23/22 1516  Thu Jan 23, 2022  1503 Assumed care from Dr Langston Masker. Found on sidewalk by EMS. Given narcan. Still with pin point pupils. Drowsy but arousable. Was given narcan ~2 hours ago. +Etoh today as well. [RP]  1516 Patient is  signed out to Dr. Philip Aspen assuming reassessment after improved sobriety [MT]    Clinical Course User Index [MT] Felix Meras, Carola Rhine, MD [RP] Fransico Meadow, MD                           Medical Decision Making Amount and/or Complexity of Data Reviewed Labs: ordered.   Patient is here after being found unresponsive outside.  Responded to Narcan in the field but now has pinpoint pupils and is again somnolent.  He is not hypoxic or hypopneic requiring additional Narcan but will need to be monitored closely, may indeed require additional Narcan if he becomes hypoxic or has any other respiratory depression.  We are awaiting labs for medical screening as well as an ethanol level.  There is no evidence of head trauma to warrant emergent CT imaging of the brain at this time.  Supplemental history provided by EMS and the patient's arrival.  I personally reviewed his external records which include prior ED visit as well as PDMP drug database.  Labs here personally viewed interpreted showing elevated EtOH level, other labs appear to be at baseline.        Final Clinical Impression(s) / ED Diagnoses Final diagnoses:  None    Rx / DC Orders ED Discharge Orders     None         Kacie Huxtable, Carola Rhine, MD 01/23/22 1517

## 2022-01-23 NOTE — Discharge Instructions (Signed)
Today you were seen in the emergency department for your intoxication and overdose.    In the emergency department you were monitored until you are sober.    Follow-up with your primary doctor in 2-3 days regarding your visit.    Return immediately to the emergency department if you experience any of the following: Chest pain, shortness of breath, confusion, or any other concerning symptoms.    Thank you for visiting our Emergency Department. It was a pleasure taking care of you today.

## 2022-01-23 NOTE — ED Notes (Signed)
Drank cup of soda.  Conversing with staff and asking questions.  Appears more awakr.

## 2022-01-23 NOTE — ED Triage Notes (Signed)
Pt was found unresponsive in a parking lot by bystanders.  Fire Department found him with decreased respirations and gave '1mg'$  IM Narcan and tactile stimulation.  Pt is still arousable to verbal stimulation and told EMS that he drank "9 bottles of liquor".  Pt denies drug use but states that he drank.

## 2022-01-23 NOTE — ED Provider Notes (Signed)
  Physical Exam  BP 112/64   Pulse 93   Temp 98.3 F (36.8 C) (Oral)   Resp 17   SpO2 95%   Physical Exam  Procedures  Procedures  ED Course / MDM   Clinical Course as of 01/24/22 2037  Thu Jan 23, 2022  1503 Assumed care from Dr Langston Masker. Found on sidewalk by EMS. Given narcan. Still with pin point pupils. Drowsy but arousable. Was given narcan ~2 hours ago. +Etoh today as well. [RP]  1516 Patient is signed out to Dr. Philip Aspen assuming reassessment after improved sobriety [MT]  1558 Reassessed.  Patient was able to give me his name.  Moving all 4 extremities equally.  Pinpoint pupils but breathing normally and satting well. [RP]  1726 Patient waking up trialing p.o. [RP]  2003 Patient is alert and oriented.  Walking about the department.  States that this was not an attempt to harm himself.  No HI.  Tolerated p.o. without difficulty.  We will discharge the patient home with instructions to follow-up with his primary doctor.  Return precautions discussed prior to discharge. [RP]    Clinical Course User Index [MT] Trifan, Carola Rhine, MD [RP] Fransico Meadow, MD   Medical Decision Making Amount and/or Complexity of Data Reviewed Labs: ordered.     Fransico Meadow, MD 01/24/22 2038

## 2022-01-26 ENCOUNTER — Emergency Department (HOSPITAL_COMMUNITY)
Admission: EM | Admit: 2022-01-26 | Discharge: 2022-01-27 | Disposition: A | Discharge status: Home / Self Care | Payer: 59 | Attending: Emergency Medicine | Admitting: Emergency Medicine

## 2022-01-26 ENCOUNTER — Encounter (HOSPITAL_COMMUNITY): Payer: Self-pay | Admitting: Emergency Medicine

## 2022-01-26 ENCOUNTER — Emergency Department (HOSPITAL_COMMUNITY): Payer: 59

## 2022-01-26 DIAGNOSIS — R0789 Other chest pain: Secondary | ICD-10-CM | POA: Insufficient documentation

## 2022-01-26 DIAGNOSIS — R079 Chest pain, unspecified: Secondary | ICD-10-CM

## 2022-01-26 LAB — COMPREHENSIVE METABOLIC PANEL
ALT: 30 U/L (ref 0–44)
AST: 40 U/L (ref 15–41)
Albumin: 4 g/dL (ref 3.5–5.0)
Alkaline Phosphatase: 49 U/L (ref 38–126)
Anion gap: 10 (ref 5–15)
BUN: 19 mg/dL (ref 6–20)
CO2: 23 mmol/L (ref 22–32)
Calcium: 8.9 mg/dL (ref 8.9–10.3)
Chloride: 103 mmol/L (ref 98–111)
Creatinine, Ser: 0.85 mg/dL (ref 0.61–1.24)
GFR, Estimated: >60 mL/min (ref >60)
Glucose, Bld: 82 mg/dL (ref 70–99)
Potassium: 4.6 mmol/L (ref 3.5–5.1)
Sodium: 136 mmol/L (ref 135–145)
Total Bilirubin: 0.6 mg/dL (ref 0.3–1.2)
Total Protein: 7.1 g/dL (ref 6.5–8.1)

## 2022-01-26 LAB — ETHANOL: Alcohol, Ethyl (B): <10 mg/dL (ref <10)

## 2022-01-26 LAB — CBC
HCT: 42 % (ref 39.0–52.0)
Hemoglobin: 14 g/dL (ref 13.0–17.0)
MCH: 32.6 pg (ref 26.0–34.0)
MCHC: 33.3 g/dL (ref 30.0–36.0)
MCV: 97.7 fL (ref 80.0–100.0)
Platelets: 204 10*3/uL (ref 150–400)
RBC: 4.3 MIL/uL (ref 4.22–5.81)
RDW: 14 % (ref 11.5–15.5)
WBC: 4.9 10*3/uL (ref 4.0–10.5)
nRBC: 0 % (ref 0.0–0.2)

## 2022-01-26 LAB — TROPONIN I (HIGH SENSITIVITY): Troponin I (High Sensitivity): 3 ng/L (ref <18)

## 2022-01-26 LAB — LIPASE, BLOOD: Lipase: 52 U/L — ABNORMAL HIGH (ref 11–51)

## 2022-01-26 NOTE — ED Provider Triage Note (Signed)
Emergency Medicine Provider Triage Evaluation Note  David Burnett , a 31 y.o. male  was evaluated in triage.  Patient with previous visits for alcohol intoxication.  Pt complains of chest pain and shortness of breath starting this morning.  States that he feels like there is something moving around in his left chest.  Denies fever or cough.  He has had episodes of vomiting today.  Review of Systems  Positive: Chest pain, shortness of breath, vomiting Negative: Fever  Physical Exam  BP (!) 150/99 (BP Location: Right Arm)   Pulse 84   Temp (!) 97.4 F (36.3 C) (Oral)   Resp 18   Ht '5\' 10"'$  (1.778 m)   Wt 72.6 kg   SpO2 96%   BMI 22.96 kg/m  Gen:   Awake, no distress, unkempt appearance Resp:  Normal effort  MSK:   Moves extremities without difficulty  Other:    Medical Decision Making  Medically screening exam initiated at 9:42 PM.  Appropriate orders placed.  Dvontae Ruan was informed that the remainder of the evaluation will be completed by another provider, this initial triage assessment does not replace that evaluation, and the importance of remaining in the ED until their evaluation is complete.     Carlisle Cater, PA-C 01/26/22 2154

## 2022-01-26 NOTE — ED Triage Notes (Signed)
Per EMS, patient from bus stop, ETOH withdrawal. Reports only one shot of whiskey this morning. History of alcohol abuse.  BP 120/64 HR 64 99%  CBG 96

## 2022-01-27 ENCOUNTER — Other Ambulatory Visit (HOSPITAL_COMMUNITY): Payer: Self-pay

## 2022-01-27 LAB — TROPONIN I (HIGH SENSITIVITY): Troponin I (High Sensitivity): 3 ng/L (ref <18)

## 2022-01-27 MED ORDER — FAMOTIDINE 20 MG PO TABS
20.0000 mg | ORAL_TABLET | Freq: Two times a day (BID) | ORAL | 0 refills | Status: DC | PRN
  Filled 2022-01-27: qty 15, 8d supply, fill #0

## 2022-01-27 MED ORDER — ONDANSETRON 4 MG PO TBDP
4.0000 mg | ORAL_TABLET | Freq: Three times a day (TID) | ORAL | 0 refills | Status: AC | PRN
  Filled 2022-01-27: qty 5, 2d supply, fill #0

## 2022-01-27 MED ORDER — SODIUM CHLORIDE 0.9 % IV BOLUS
1000.0000 mL | Freq: Once | INTRAVENOUS | Status: AC
  Administered 2022-01-27: 1000 mL via INTRAVENOUS

## 2022-01-27 MED ORDER — FAMOTIDINE IN NACL 20-0.9 MG/50ML-% IV SOLN
20.0000 mg | Freq: Once | INTRAVENOUS | Status: AC
  Administered 2022-01-27: 20 mg via INTRAVENOUS
  Filled 2022-01-27: qty 50

## 2022-01-27 NOTE — Discharge Instructions (Addendum)
You were seen in the emergency department today for chest pain. Your work-up in the emergency department has been overall reassuring. Your labs have been fairly normal and or similar to previous blood work you have had done. Your EKG and the enzyme we use to check your heart did not show an acute heart attack at this time. Your chest x-ray was normal.  We are sending you home with Pepcid to take every 12 hours as needed for chest/abdominal pain and Zofran to take every 8 hours as needed for nausea/vomiting.  We have prescribed you new medication(s) today. Discuss the medications prescribed today with your pharmacist as they can have adverse effects and interactions with your other medicines including over the counter and prescribed medications. Seek medical evaluation if you start to experience new or abnormal symptoms after taking one of these medicines, seek care immediately if you start to experience difficulty breathing, feeling of your throat closing, facial swelling, or rash as these could be indications of a more serious allergic reaction  Please be sure to stay well-hydrated.  We would like you to follow up closely with your primary care provider  within 1-3 days for recheck of your symptoms as well as for possible discussion of Holter monitoring.. Return to the ER immediately should you experience any new or worsening symptoms including but not limited to return of pain, worsened pain, vomiting, shortness of breath, dizziness, lightheadedness, passing out, inability to keep fluids down, or any other concerns that you may have.

## 2022-01-27 NOTE — ED Provider Notes (Signed)
[facility] DEPT Provider Note   CSN: [id number] Arrival date & time: 01/26/22  2118     History  Chief Complaint  Patient presents with   Chest Pain    David Burnett is a 31 y.o. male with a hx of EtOH use who presents to the ED with complaints of chest discomfort that started yesterday AM. Patient reports discomfort is in the left side of his chest, he has a hard time describing, sometimes stating it is like palpitations and sometimes like dyspnea, occurring intermittently, no alleviating/aggravating factors. Had emesis x 1 earlier. States it feels like it did when he needed some fluids and acid reflux medicine before. There was some mention of withdrawal in triage- patient denies this to me. Denies  syncope, hemoptysis, leg pain/swelling, diarrhea, hematemesis, melena, prior VTE/cancer, recent travel/surgery/trauma, or hormone use.   HPI     Home Medications Prior to Admission medications   Medication Sig Start Date End Date Taking? Authorizing Provider  Multiple Vitamin (MULTIVITAMIN WITH MINERALS) TABS tablet Take 1 tablet by mouth daily. 03/21/17   Bonnielee Haff, MD  potassium chloride SA (KLOR-CON M) 20 MEQ tablet Take 1 tablet (20 mEq total) by mouth 2 (two) times daily. 08/04/21   Daleen Bo, MD  ranitidine (ZANTAC) 150 MG capsule Take 1 capsule (150 mg total) by mouth 2 (two) times daily. 03/21/17   Bonnielee Haff, MD      Allergies    Patient has no known allergies.    Review of Systems   Review of Systems  Constitutional:  Negative for chills and fever.  Respiratory:  Positive for shortness of breath.   Cardiovascular:  Positive for chest pain and palpitations. Negative for leg swelling.  Gastrointestinal:  Positive for nausea and vomiting. Negative for abdominal pain.  Neurological:  Negative for syncope.  All other systems reviewed and are negative.   Physical Exam Updated Vital Signs BP (!) 124/94 (BP Location: Right  Wrist)   Pulse 73   Temp 98.8 F (37.1 C) (Oral)   Resp 18   Ht '5\' 10"'$  (1.778 m)   Wt 72.6 kg   SpO2 98%   BMI 22.96 kg/m  Physical Exam Vitals and nursing note reviewed.  Constitutional:      General: He is not in acute distress.    Appearance: He is well-developed. He is not toxic-appearing.  HENT:     Head: Normocephalic and atraumatic.  Eyes:     General:        Right eye: No discharge.        Left eye: No discharge.     Conjunctiva/sclera: Conjunctivae normal.  Cardiovascular:     Rate and Rhythm: Normal rate and regular rhythm.  Pulmonary:     Effort: No respiratory distress.     Breath sounds: Normal breath sounds. No wheezing or rales.  Abdominal:     General: There is no distension.     Palpations: Abdomen is soft.     Tenderness: There is no abdominal tenderness. There is no guarding or rebound.  Musculoskeletal:     Cervical back: Neck supple.     Right lower leg: No tenderness. No edema.     Left lower leg: No tenderness. No edema.  Skin:    General: Skin is warm and dry.  Neurological:     Mental Status: He is alert.     Comments: Clear speech.   Psychiatric:        Behavior: Behavior normal.  ED Results / Procedures / Treatments   Labs (all labs ordered are listed, but only abnormal results are displayed) Labs Reviewed  LIPASE, BLOOD - Abnormal; Notable for the following components:      Result Value   Lipase 52 (*)    All other components within normal limits  CBC  COMPREHENSIVE METABOLIC PANEL  ETHANOL  TROPONIN I (HIGH SENSITIVITY)  TROPONIN I (HIGH SENSITIVITY)    EKG EKG Interpretation  Date/Time:  'Sunday January 26 2022 22:34:20 EDT Ventricular Rate:  62 PR Interval:  140 QRS Duration: 90 QT Interval:  399 QTC Calculation: 406 R Axis:   86 Text Interpretation: Sinus rhythm LVH by voltage Confirmed by Molpus, John ([id number]) on 01/27/2022 5:48:34 AM  Radiology DG Chest Port 1 View  Result Date: 01/26/2022 CLINICAL DATA:   Chest pain and dyspnea EXAM: PORTABLE CHEST 1 VIEW COMPARISON:  Radiographs 09/21/2021 FINDINGS: No focal consolidation, pleural effusion, or pneumothorax. Normal cardiomediastinal silhouette. No acute osseous abnormality. IMPRESSION: No active disease. Electronically Signed   By: Tyler  Stutzman M.D.   On: 01/26/2022 22:30    Procedures Procedures    Medications Ordered in ED Medications  famotidine (PEPCID) IVPB 20 mg premix (20 mg Intravenous New Bag/Given 01/27/22 0306)  sodium chloride 0.9 % bolus 1,000 mL (1,000 mLs Intravenous New Bag/Given 01/27/22 0305)    ED Course/ Medical Decision Making/ A&P                           Medical Decision Making Risk Prescription drug management.  Patient presents to the emergency department with chest pain. Patient nontoxic appearing, in no apparent distress, vitals without significant abnormality, BP mildly elevated- doubt HTN emergency. Fairly benign physical exam.   DDX including but not limited to: ACS, pulmonary embolism, dissection, pneumothorax, pneumonia, arrhythmia, severe anemia, MSK, GERD, anxiety, abdominal process, withdrawal.   Additional history obtained:  Chart & nursing note reviewed.   EKG: Sinus rhythm LVH by voltage   Lab Tests:  I reviewed & interpreted labs including:  CBC, CMP, lipase, troponins, ethanol level: Fairly unremarkable, minimally elevated lipase, abdomen is nontender.  Troponins are flat.  No critical electrolyte derangement or anemia.  Imaging Studies ordered:  I viewed the following imaging, agree with radiologist impression: No active disease  ED Course:  I ordered medications including fluids and Pepcid  Heart Pathway Score low risk- EKG without obvious acute ischemia, delta troponin negative, doubt ACS. Patient is low risk wells, PERC negative, doubt pulmonary embolism. Pain is not a tearing sensation, symmetric pulses, no widening of mediastinum on CXR, doubt dissection. Cardiac monitor reviewed,  no notable arrhythmias or tachycardia .  Abdomen nontender without peritoneal signs.  Chest x-ray and labs reassuring.  Patient feeling improved.  No clinical findings to suggest withdrawal.  Overall appears appropriate for discharge at this time.  I discussed results, treatment plan, need for PCP follow-up, and return precautions with the patient. Provided opportunity for questions, patient confirmed understanding and is in agreement with plan.   Portions of this note were generated with Dragon dictation software. Dictation errors may occur despite best attempts at proofreading.  Final Clinical Impression(s) / ED Diagnoses Final diagnoses:  Chest pain, unspecified type    Rx / DC Orders ED Discharge Orders          Ordered    famotidine (PEPCID) 20 MG tablet  2 times daily PRN        09'$ /11/23 0557  ondansetron (ZOFRAN-ODT) 4 MG disintegrating tablet  Every 8 hours PRN        01/27/22 0557              Amaryllis Dyke, PA-C 01/27/22 0627    Shanon Rosser, MD 01/27/22 [phone]

## 2022-02-05 ENCOUNTER — Emergency Department (HOSPITAL_COMMUNITY): Payer: 59

## 2022-02-05 ENCOUNTER — Emergency Department (HOSPITAL_COMMUNITY)
Admission: EM | Admit: 2022-02-05 | Discharge: 2022-02-06 | Disposition: A | Discharge status: Home / Self Care | Payer: 59 | Attending: Emergency Medicine | Admitting: Emergency Medicine

## 2022-02-05 DIAGNOSIS — F10129 Alcohol abuse with intoxication, unspecified: Secondary | ICD-10-CM | POA: Diagnosis present

## 2022-02-05 DIAGNOSIS — Y908 Blood alcohol level of 240 mg/100 ml or more: Secondary | ICD-10-CM | POA: Diagnosis not present

## 2022-02-05 LAB — CBC WITH DIFFERENTIAL/PLATELET
Abs Immature Granulocytes: 0.01 10*3/uL (ref 0.00–0.07)
Basophils Absolute: 0 10*3/uL (ref 0.0–0.1)
Basophils Relative: 1 %
Eosinophils Absolute: 0.1 10*3/uL (ref 0.0–0.5)
Eosinophils Relative: 2 %
HCT: 44.2 % (ref 39.0–52.0)
Hemoglobin: 14.5 g/dL (ref 13.0–17.0)
Immature Granulocytes: 0 %
Lymphocytes Relative: 45 %
Lymphs Abs: 2.1 10*3/uL (ref 0.7–4.0)
MCH: 32.3 pg (ref 26.0–34.0)
MCHC: 32.8 g/dL (ref 30.0–36.0)
MCV: 98.4 fL (ref 80.0–100.0)
Monocytes Absolute: 0.5 10*3/uL (ref 0.1–1.0)
Monocytes Relative: 10 %
Neutro Abs: 2 10*3/uL (ref 1.7–7.7)
Neutrophils Relative %: 42 %
Platelets: 290 10*3/uL (ref 150–400)
RBC: 4.49 MIL/uL (ref 4.22–5.81)
RDW: 13.8 % (ref 11.5–15.5)
WBC: 4.7 10*3/uL (ref 4.0–10.5)
nRBC: 0 % (ref 0.0–0.2)

## 2022-02-05 LAB — COMPREHENSIVE METABOLIC PANEL
ALT: 28 U/L (ref 0–44)
AST: 40 U/L (ref 15–41)
Albumin: 4.4 g/dL (ref 3.5–5.0)
Alkaline Phosphatase: 53 U/L (ref 38–126)
Anion gap: 10 (ref 5–15)
BUN: 10 mg/dL (ref 6–20)
CO2: 25 mmol/L (ref 22–32)
Calcium: 9.3 mg/dL (ref 8.9–10.3)
Chloride: 102 mmol/L (ref 98–111)
Creatinine, Ser: 1.2 mg/dL (ref 0.61–1.24)
GFR, Estimated: >60 mL/min (ref >60)
Glucose, Bld: 202 mg/dL — ABNORMAL HIGH (ref 70–99)
Potassium: 3.9 mmol/L (ref 3.5–5.1)
Sodium: 137 mmol/L (ref 135–145)
Total Bilirubin: 0.2 mg/dL — ABNORMAL LOW (ref 0.3–1.2)
Total Protein: 7.4 g/dL (ref 6.5–8.1)

## 2022-02-05 LAB — AMMONIA: Ammonia: 18 umol/L (ref 9–35)

## 2022-02-05 LAB — LACTIC ACID, PLASMA: Lactic Acid, Venous: 2.9 mmol/L (ref 0.5–1.9)

## 2022-02-05 LAB — CBG MONITORING, ED: Glucose-Capillary: 208 mg/dL — ABNORMAL HIGH (ref 70–99)

## 2022-02-05 LAB — ETHANOL: Alcohol, Ethyl (B): 286 mg/dL — ABNORMAL HIGH (ref <10)

## 2022-02-05 MED ORDER — SODIUM CHLORIDE 0.9 % IV BOLUS
1000.0000 mL | Freq: Once | INTRAVENOUS | Status: AC
  Administered 2022-02-05: 1000 mL via INTRAVENOUS

## 2022-02-05 NOTE — ED Notes (Signed)
Pt unable to answer questions for triage and unable to sign for Mse, EDP at bedside upon pt arrival for assessment

## 2022-02-05 NOTE — ED Triage Notes (Addendum)
Pt BIB [facility] after being called out for unresponsiveness on the sidewalk at [place]; upon arrival pt responsive to painful stimuli only; pt given '2mg'$  Narcan IN; CBG 49 after 24m D10 CBG up to 297, pt still responsive only to painful stimuli, slightly hypoxic at 90% other v/s WNL; pt was reported to be drinking whiskey on the sidewalk

## 2022-02-05 NOTE — ED Provider Notes (Signed)
[facility] EMERGENCY DEPARTMENT Provider Note   CSN: [id number] Arrival date & time: 02/05/22  2052     History  Chief Complaint  Patient presents with   David Burnett David Burnett is a 31 y.o. male.  Pt is a 31 yo male with a pmhx significant for polysubstance abuse.  He has been here several times for alcohol intoxication.  Tonight, he was seen drinking whisky outside [place].  He was seen by staff unresponsive on the sidewalk, so they called EMS.  Pt was breathing, but fire gave pt 2 mg Narcan IN to see if it would help him become more responsive.  He did not have any response to the narcan.  His initial BS was 49, so EMS gave him D10 and BS is up to 297.  Still no response.  Pt is unable to contribute to hx.        Home Medications Prior to Admission medications   Medication Sig Start Date End Date Taking? Authorizing Provider  famotidine (PEPCID) 20 MG tablet Take 1 tablet (20 mg total) by mouth 2 (two) times daily as needed for heartburn or indigestion (chest pain). 01/27/22   Petrucelli, Samantha R, PA-C  ondansetron (ZOFRAN-ODT) 4 MG disintegrating tablet Take 1 tablet (4 mg total) by mouth every 8 (eight) hours as needed for nausea or vomiting. 01/27/22   Petrucelli, Glynda Jaeger, PA-C      Allergies    Patient has no known allergies.    Review of Systems   Review of Systems  Unable to perform ROS: Mental status change  All other systems reviewed and are negative.   Physical Exam Updated Vital Signs BP 117/79   Pulse 81   Temp (!) 97.5 F (36.4 C) (Oral)   Resp 18   Ht '5\' 10"'$  (1.778 m)   Wt 72.6 kg   SpO2 97%   BMI 22.96 kg/m  Physical Exam Vitals and nursing note reviewed.  Constitutional:      Comments: Pt smells of alcohol.  He will respond to painful stimuli.  HENT:     Head: Normocephalic and atraumatic.     Right Ear: External ear normal.     Left Ear: External ear normal.     Nose: Nose normal.     Mouth/Throat:      Mouth: Mucous membranes are dry.  Eyes:     Extraocular Movements: Extraocular movements intact.     Conjunctiva/sclera: Conjunctivae normal.     Pupils: Pupils are equal, round, and reactive to light.  Neck:     Comments: Pt in a c-collar Cardiovascular:     Rate and Rhythm: Normal rate and regular rhythm.     Pulses: Normal pulses.     Heart sounds: Normal heart sounds.  Pulmonary:     Effort: Pulmonary effort is normal.     Breath sounds: Normal breath sounds.  Abdominal:     General: Abdomen is flat. Bowel sounds are normal.     Palpations: Abdomen is soft.  Musculoskeletal:        General: Normal range of motion.  Skin:    General: Skin is warm.     Capillary Refill: Capillary refill takes less than 2 seconds.  Neurological:     General: No focal deficit present.     Comments: Pt will respond to painful stimuli, but will not follow any commands. He is somnolent.  Psychiatric:     Comments: Unable to assess  ED Results / Procedures / Treatments   Labs (all labs ordered are listed, but only abnormal results are displayed) Labs Reviewed  COMPREHENSIVE METABOLIC PANEL - Abnormal; Notable for the following components:      Result Value   Glucose, Bld 202 (*)    Total Bilirubin 0.2 (*)    All other components within normal limits  LACTIC ACID, PLASMA - Abnormal; Notable for the following components:   Lactic Acid, Venous 2.9 (*)    All other components within normal limits  ETHANOL - Abnormal; Notable for the following components:   Alcohol, Ethyl (B) 286 (*)    All other components within normal limits  CBG MONITORING, ED - Abnormal; Notable for the following components:   Glucose-Capillary 208 (*)    All other components within normal limits  CBC WITH DIFFERENTIAL/PLATELET  AMMONIA  URINALYSIS, ROUTINE W REFLEX MICROSCOPIC  RAPID URINE DRUG SCREEN, HOSP PERFORMED    EKG EKG Interpretation  Date/Time:  Wednesday February 05 2022 21:00:59  EDT Ventricular Rate:  75 PR Interval:  203 QRS Duration: 97 QT Interval:  386 QTC Calculation: 432 R Axis:   76 Text Interpretation: Sinus rhythm Borderline prolonged PR interval Consider left atrial enlargement Probable left ventricular hypertrophy ST elev, probable normal early repol pattern No significant change since last tracing Confirmed by Isla Pence [phone]) on 02/05/2022 9:06:48 PM  Radiology DG Chest Portable 1 View  Result Date: 02/05/2022 CLINICAL DATA:  Altered mental status. EXAM: PORTABLE CHEST 1 VIEW COMPARISON:  Radiograph 01/26/2022 FINDINGS: Lung volumes are low. Stable heart size and mediastinal contours. No focal airspace disease, pleural effusion, pneumothorax or pulmonary edema. Gaseous gastric distention in the upper abdomen. IMPRESSION: Low lung volumes without acute abnormality. Electronically Signed   By: Keith Rake M.D.   On: 02/05/2022 21:09    Procedures Procedures    Medications Ordered in ED Medications  sodium chloride 0.9 % bolus 1,000 mL (0 mLs Intravenous Stopped 02/05/22 2222)  sodium chloride 0.9 % bolus 1,000 mL (1,000 mLs Intravenous New Bag/Given 02/05/22 2222)    ED Course/ Medical Decision Making/ A&P                           Medical Decision Making Amount and/or Complexity of Data Reviewed Labs: ordered. Radiology: ordered.   This patient presents to the ED for concern of AMS, this involves an extensive number of treatment options, and is a complaint that carries with it a high risk of complications and morbidity.  The differential diagnosis includes alcohol intox, opiate od, electrolyte abn, trauma, infection   Co morbidities that complicate the patient evaluation  Polysubstance abuse   Additional history obtained:  Additional history obtained from epic chart review External records from outside source obtained and reviewed including EMS report   Lab Tests:  I Ordered, and personally interpreted labs.  The  pertinent results include:  cmp with glucose 202; cbc nl; ammonia nl at 18; lactic elevated at 2.9; etoh elevated at 286   Imaging Studies ordered:  I ordered imaging studies including cxr, ct head/c-spine I independently visualized and interpreted imaging which showed  IMPRESSION:  Low lung volumes without acute abnormality.  CT head/C-spine pending at shift change.  Pt signed out to Dr. Dayna Barker. I agree with the radiologist interpretation   Cardiac Monitoring:  The patient was maintained on a cardiac monitor.  I personally viewed and interpreted the cardiac monitored which showed an underlying rhythm of: nsr  Medicines ordered and prescription drug management:  I ordered medication including IVFs  for dehydration  Reevaluation of the patient after these medicines showed that the patient improved I have reviewed the patients home medicines and have made adjustments as needed   Test Considered:  ct   Critical Interventions:  ivfs   Problem List / ED Course:  AMS due to EtOH intox.  Pt will need to sober up enough to walk and talk prior to d/c.  However, I anticipate d/c.   Reevaluation:  After the interventions noted above, I reevaluated the patient and found that they have :improved   Social Determinants of Health:  Pt has an address in [place], [place].  However, I suspect he is homeless.   Dispostion:  After consideration of the diagnostic results and the patients response to treatment, I feel that the patent would benefit from discharge with outpatient f/u.          Final Clinical Impression(s) / ED Diagnoses Final diagnoses:  Alcohol abuse with intoxication [facility])    Rx / DC Orders ED Discharge Orders     None         Isla Pence, MD 02/05/22 2314

## 2022-02-06 ENCOUNTER — Emergency Department (HOSPITAL_COMMUNITY): Payer: 59

## 2022-02-06 ENCOUNTER — Other Ambulatory Visit (HOSPITAL_COMMUNITY): Payer: 59

## 2022-02-06 LAB — RAPID URINE DRUG SCREEN, HOSP PERFORMED
Amphetamines: NOT DETECTED
Barbiturates: NOT DETECTED
Benzodiazepines: NOT DETECTED
Cocaine: POSITIVE — AB
Opiates: NOT DETECTED
Tetrahydrocannabinol: NOT DETECTED

## 2022-02-06 LAB — URINALYSIS, ROUTINE W REFLEX MICROSCOPIC
Bilirubin Urine: NEGATIVE
Glucose, UA: 50 mg/dL — AB
Ketones, ur: NEGATIVE mg/dL
Leukocytes,Ua: NEGATIVE
Nitrite: NEGATIVE
Protein, ur: NEGATIVE mg/dL
Specific Gravity, Urine: 1.003 — ABNORMAL LOW (ref 1.005–1.030)
pH: 6 (ref 5.0–8.0)

## 2022-02-06 NOTE — ED Notes (Signed)
Pt ambulated in the hall with no assistance and no complaints of deficits.

## 2022-02-06 NOTE — ED Notes (Signed)
Patient verbalizes understanding of discharge instructions. Opportunity for questioning and answers were provided. Armband removed by staff, pt discharged from ED ambulatory.   

## 2022-02-06 NOTE — ED Provider Notes (Signed)
6:06 AM Assumed care from Dr. Gilford Raid, please see their note for full history, physical and decision making until this point. In brief this is a 31 y.o. year old male who presented to the ED tonight with unrespnsive      Intoxicated. Pending metabolization.   After approximately 9 hours he has successfully eating, drank fluids and ambulated without obvious intoxication. No complaints at this time. Appears to be stable for discharge.   Discharge instructions, including strict return precautions for new or worsening symptoms, given. Patient and/or family verbalized understanding and agreement with the plan as described.   Labs, studies and imaging reviewed by myself and considered in medical decision making if ordered. Imaging interpreted by radiology.  Labs Reviewed  COMPREHENSIVE METABOLIC PANEL - Abnormal; Notable for the following components:      Result Value   Glucose, Bld 202 (*)    Total Bilirubin 0.2 (*)    All other components within normal limits  URINALYSIS, ROUTINE W REFLEX MICROSCOPIC - Abnormal; Notable for the following components:   Color, Urine STRAW (*)    Specific Gravity, Urine 1.003 (*)    Glucose, UA 50 (*)    Hgb urine dipstick LARGE (*)    Bacteria, UA RARE (*)    All other components within normal limits  LACTIC ACID, PLASMA - Abnormal; Notable for the following components:   Lactic Acid, Venous 2.9 (*)    All other components within normal limits  ETHANOL - Abnormal; Notable for the following components:   Alcohol, Ethyl (B) 286 (*)    All other components within normal limits  RAPID URINE DRUG SCREEN, HOSP PERFORMED - Abnormal; Notable for the following components:   Cocaine POSITIVE (*)    All other components within normal limits  CBG MONITORING, ED - Abnormal; Notable for the following components:   Glucose-Capillary 208 (*)    All other components within normal limits  CBC WITH DIFFERENTIAL/PLATELET  AMMONIA    CT Head Wo Contrast  Final Result     CT Cervical Spine Wo Contrast  Final Result    DG Chest Portable 1 View  Final Result      No follow-ups on file.    Rolland Steinert, Corene Cornea, MD 02/06/22 [phone]

## 2022-11-13 ENCOUNTER — Emergency Department (HOSPITAL_COMMUNITY): Payer: Self-pay

## 2022-11-13 ENCOUNTER — Emergency Department (HOSPITAL_COMMUNITY)
Admission: EM | Admit: 2022-11-13 | Discharge: 2022-11-14 | Disposition: A | Discharge status: Home / Self Care | Payer: Self-pay | Attending: Emergency Medicine | Admitting: Emergency Medicine

## 2022-11-13 ENCOUNTER — Other Ambulatory Visit: Payer: Self-pay

## 2022-11-13 ENCOUNTER — Encounter (HOSPITAL_COMMUNITY): Payer: Self-pay

## 2022-11-13 DIAGNOSIS — F10129 Alcohol abuse with intoxication, unspecified: Secondary | ICD-10-CM | POA: Insufficient documentation

## 2022-11-13 DIAGNOSIS — F10920 Alcohol use, unspecified with intoxication, uncomplicated: Secondary | ICD-10-CM

## 2022-11-13 DIAGNOSIS — Y906 Blood alcohol level of 120-199 mg/100 ml: Secondary | ICD-10-CM | POA: Insufficient documentation

## 2022-11-13 DIAGNOSIS — R4182 Altered mental status, unspecified: Secondary | ICD-10-CM | POA: Insufficient documentation

## 2022-11-13 DIAGNOSIS — D539 Nutritional anemia, unspecified: Secondary | ICD-10-CM | POA: Insufficient documentation

## 2022-11-13 DIAGNOSIS — R7309 Other abnormal glucose: Secondary | ICD-10-CM | POA: Insufficient documentation

## 2022-11-13 LAB — CBC
HCT: 31.9 % — ABNORMAL LOW (ref 39.0–52.0)
Hemoglobin: 10.4 g/dL — ABNORMAL LOW (ref 13.0–17.0)
MCH: 33.1 pg (ref 26.0–34.0)
MCHC: 32.6 g/dL (ref 30.0–36.0)
MCV: 101.6 fL — ABNORMAL HIGH (ref 80.0–100.0)
Platelets: 235 10*3/uL (ref 150–400)
RBC: 3.14 MIL/uL — ABNORMAL LOW (ref 4.22–5.81)
RDW: 14.1 % (ref 11.5–15.5)
WBC: 4.2 10*3/uL (ref 4.0–10.5)
nRBC: 0 % (ref 0.0–0.2)

## 2022-11-13 LAB — COMPREHENSIVE METABOLIC PANEL
ALT: 26 U/L (ref 0–44)
AST: 24 U/L (ref 15–41)
Albumin: 3.4 g/dL — ABNORMAL LOW (ref 3.5–5.0)
Alkaline Phosphatase: 46 U/L (ref 38–126)
Anion gap: 10 (ref 5–15)
BUN: 18 mg/dL (ref 6–20)
CO2: 20 mmol/L — ABNORMAL LOW (ref 22–32)
Calcium: 8.5 mg/dL — ABNORMAL LOW (ref 8.9–10.3)
Chloride: 109 mmol/L (ref 98–111)
Creatinine, Ser: 0.74 mg/dL (ref 0.61–1.24)
GFR, Estimated: >60 mL/min (ref >60)
Glucose, Bld: 96 mg/dL (ref 70–99)
Potassium: 3.2 mmol/L — ABNORMAL LOW (ref 3.5–5.1)
Sodium: 139 mmol/L (ref 135–145)
Total Bilirubin: 0.3 mg/dL (ref 0.3–1.2)
Total Protein: 6.1 g/dL — ABNORMAL LOW (ref 6.5–8.1)

## 2022-11-13 LAB — ETHANOL: Alcohol, Ethyl (B): 123 mg/dL — ABNORMAL HIGH (ref <10)

## 2022-11-13 LAB — CBG MONITORING, ED: Glucose-Capillary: 106 mg/dL — ABNORMAL HIGH (ref 70–99)

## 2022-11-13 MED ORDER — ALUM & MAG HYDROXIDE-SIMETH 200-200-20 MG/5ML PO SUSP
30.0000 mL | Freq: Once | ORAL | Status: AC
  Administered 2022-11-13: 30 mL via ORAL
  Filled 2022-11-13: qty 30

## 2022-11-13 MED ORDER — FAMOTIDINE 20 MG PO TABS
20.0000 mg | ORAL_TABLET | Freq: Once | ORAL | Status: AC
  Administered 2022-11-13: 20 mg via ORAL
  Filled 2022-11-13: qty 1

## 2022-11-13 MED ORDER — CHLORDIAZEPOXIDE HCL 25 MG PO CAPS: ORAL_CAPSULE | ORAL | 0 refills | Status: AC

## 2022-11-13 MED ORDER — FAMOTIDINE 20 MG PO TABS: 20.0000 mg | ORAL_TABLET | Freq: Two times a day (BID) | ORAL | 0 refills | Status: AC | PRN

## 2022-11-13 NOTE — Discharge Instructions (Addendum)
You will need to stop drinking alcohol.  If you are serious about quitting alcohol then you will need to use the Librium that I prescribed you as prescribed. Take Pepcid as prescribed 1 tablet twice daily.  This will help soothe the irritation of your stomach which is cause you to vomit blood in the past.  I have printed you some resources on counseling for substance use.  It is noted that you have a low hemoglobin, this could be from alcohol use or possibly from a small bleed, would like you to follow-up with your primary care doctor for reassessment I given you contact information of community health and wellness please follow-up.  Come back to the emergency department if you develop chest pain, shortness of breath, severe abdominal pain, uncontrolled nausea, vomiting, diarrhea.

## 2022-11-13 NOTE — ED Provider Notes (Signed)
[place] EMERGENCY DEPARTMENT AT [facility] Provider Note   CSN: [id number] Arrival date & time: 11/13/22  1812     History  Chief Complaint  Patient presents with   Alcohol Intoxication    David Burnett is a 32 y.o. male.   Alcohol Intoxication  Patient is a 32 year old male with a past medical history significant for alcohol use disorder, rhabdomyolysis   Brought in by EMS for altered mental status.  Seems that patient was seen sitting on the curb near a gas station drinking mouthwash when EMS arrived.  According to bystanders he had slumped off of the curb at 1 point earlier before EMS arrived which had prompted bystanders recall EMS.  Upon EMS arrival he was not slumped over and was interactive but quite somnolent.  They placed an IV in his arm and gave him 500 mL of normal saline and transported him to the emergency room.  He is very somnolent, awakens to sternal rub    Home Medications Prior to Admission medications   Medication Sig Start Date End Date Taking? Authorizing Provider  famotidine (PEPCID) 20 MG tablet Take 1 tablet (20 mg total) by mouth 2 (two) times daily as needed for heartburn or indigestion (chest pain). 01/27/22   Petrucelli, Samantha R, PA-C  ondansetron (ZOFRAN-ODT) 4 MG disintegrating tablet Take 1 tablet (4 mg total) by mouth every 8 (eight) hours as needed for nausea or vomiting. 01/27/22   Petrucelli, Pleas Koch, PA-C      Allergies    Patient has no known allergies.    Review of Systems   Review of Systems  Physical Exam Updated Vital Signs BP 107/64   Pulse 84   Temp 97.7 F (36.5 C) (Axillary)   Resp 15   SpO2 99%  Physical Exam Vitals and nursing note reviewed.  Constitutional:      General: He is not in acute distress.    Comments: 32 year old male, asleep, awakens to sternal rub and answers questions before falling back asleep.  HENT:     Head: Normocephalic and atraumatic.     Nose: Nose normal.      Mouth/Throat:     Mouth: Mucous membranes are moist.  Eyes:     General: No scleral icterus. Cardiovascular:     Rate and Rhythm: Normal rate and regular rhythm.     Pulses: Normal pulses.     Heart sounds: Normal heart sounds.  Pulmonary:     Effort: Pulmonary effort is normal. No respiratory distress.     Breath sounds: No wheezing.  Abdominal:     Palpations: Abdomen is soft.     Tenderness: There is no abdominal tenderness.  Musculoskeletal:     Cervical back: Normal range of motion.     Right lower leg: No edema.     Left lower leg: No edema.  Skin:    General: Skin is warm and dry.     Capillary Refill: Capillary refill takes less than 2 seconds.  Neurological:     Mental Status: He is alert. Mental status is at baseline.  Psychiatric:        Mood and Affect: Mood normal.        Behavior: Behavior normal.     ED Results / Procedures / Treatments   Labs (all labs ordered are listed, but only abnormal results are displayed) Labs Reviewed  CBC - Abnormal; Notable for the following components:      Result Value   RBC 3.14 (*)  Hemoglobin 10.4 (*)    HCT 31.9 (*)    MCV 101.6 (*)    All other components within normal limits  COMPREHENSIVE METABOLIC PANEL - Abnormal; Notable for the following components:   Potassium 3.2 (*)    CO2 20 (*)    Calcium 8.5 (*)    Total Protein 6.1 (*)    Albumin 3.4 (*)    All other components within normal limits  ETHANOL - Abnormal; Notable for the following components:   Alcohol, Ethyl (B) 123 (*)    All other components within normal limits  CBG MONITORING, ED - Abnormal; Notable for the following components:   Glucose-Capillary 106 (*)    All other components within normal limits    EKG EKG Interpretation Date/Time:  Thursday November 13 2022 19:09:29 EDT Ventricular Rate:  90 PR Interval:  156 QRS Duration:  92 QT Interval:  347 QTC Calculation: 425 R Axis:   75  Text Interpretation: Sinus rhythm ST elev, probable  normal early repol pattern Confirmed by Alona Bene [phone]) on 11/13/2022 7:12:20 PM  Radiology CT HEAD WO CONTRAST ( )  Result Date: 11/13/2022 CLINICAL DATA:  Altered mental status. EXAM: CT HEAD WITHOUT CONTRAST TECHNIQUE: Contiguous axial images were obtained from the base of the skull through the vertex without intravenous contrast. RADIATION DOSE REDUCTION: This exam was performed according to the departmental dose-optimization program which includes automated exposure control, adjustment of the mA and/or kV according to patient size and/or use of iterative reconstruction technique. COMPARISON:  February 06, 2022 FINDINGS: Brain: No evidence of acute infarction, hemorrhage, hydrocephalus, extra-axial collection or mass lesion/mass effect. Vascular: No hyperdense vessel or unexpected calcification. Skull: Normal. Negative for fracture or focal lesion. Sinuses/Orbits: A chronic appearing deformity is seen involving the medial wall of the left orbit. Other: None. IMPRESSION: No acute intracranial abnormality. Electronically Signed   By: Aram Candela M.D.   On: 11/13/2022 20:49    Procedures Procedures    Medications Ordered in ED Medications - No data to display  ED Course/ Medical Decision Making/ A&P                             Medical Decision Making Amount and/or Complexity of Data Reviewed Labs: ordered. Radiology: ordered.   Patient is a 32 year old male with a past medical history significant for alcohol use disorder, rhabdomyolysis   Brought in by EMS for altered mental status.  Seems that patient was seen sitting on the curb near a gas station drinking mouthwash when EMS arrived.  According to bystanders he had slumped off of the curb at 1 point earlier before EMS arrived which had prompted bystanders recall EMS.  Upon EMS arrival he was not slumped over and was interactive but quite somnolent.  They placed an IV in his arm and gave him 500 mL of normal saline and  transported him to the emergency room.  He is very somnolent, awakens to sternal rub  Ethanol elevated at 123, given that he has been drinking alcohol from mouthwash I have concerned that he may be drinking other kinds of alcohol including isopropyl alcohol which would not show up on testing for ethanol.  CMP with mild hypokalemia otherwise unremarkable, CBC with new anemia of 10.4.  Patient has normal vital signs, no tachycardia or hypotension.  I did obtain CT head because of how obtunded patient has this was personally viewed by me which is without any abnormal findings.  Patient reevaluated seems to be somewhat improving.  Will continue to monitor.  Patient care handed off to evening team for metabolize to freedom.  Final Clinical Impression(s) / ED Diagnoses Final diagnoses:  Alcoholic intoxication without complication [facility])    Rx / DC Orders ED Discharge Orders     None         Gailen Shelter, [place] 11/13/22 2139    Maia Plan, MD 11/18/22 [phone]

## 2022-11-13 NOTE — ED Notes (Addendum)
Pt has 1 black backpack, half empty bottle of mouthwash placed at nurses station, has been wanded by security

## 2022-11-13 NOTE — ED Triage Notes (Signed)
BIBA for alcohol intoxication, pt was reportedly passed out at the gas station. Per EMS, Pt was sitting on the curb drinking mouthwash when they arrived. 500 cc NS given PTA. 104/60 BP 98 cbg 18 l forearm

## 2022-11-13 NOTE — ED Notes (Signed)
Per EMS someone needs to go through bag, per security staff can ask Off Duty officer 19:00.

## 2022-11-14 NOTE — ED Provider Notes (Signed)
Received at shift change from [facility] please see note for full detail  In short patient with medical history including alcohol dependency, presenting with alcohol intoxication.  Patient states that he was found on a curb and was brought here to the emergency department, he states that he did not want to be here, states that he was drinking liquor all day, is not endorsing any complaints he denies any recent falls, any neck pain, back pain, pain in the upper or lower extremities, nonnursing chest pain shortness of breath, no associated stomach pains no associated nausea vomiting diarrhea denies any bloody emesis or coffee-ground emesis denies any dark tarry stools or bloody stools.  It is noted that patient was found with mouthwash, patient seen multiple times that he did not drink any of the mouthwash, he states he had because he does not like the aftertaste of alcohol.  Per previous provider will outpatient to's metabolize to baseline that can be discharged home.  Reviewed patient's chart was living down in [facility], had multiple ED visits due to alcohol intoxication.  He was admitted to the hospital due to hematemesis, GI was consulted and he was could undergo endoscopy, but patient left prior to procedure.  Most recent hemoglobin from April was 12.2, patient adamantly denies any hematemesis, coffee-ground emesis bloody stools or dark tarry stools denies any hematuria or frequent nosebleeds.   Physical Exam  BP 108/71   Pulse 79   Temp 98 F (36.7 C) (Oral)   Resp 15   SpO2 100%   Physical Exam Vitals and nursing note reviewed.  Constitutional:      General: He is not in acute distress.    Appearance: He is not ill-appearing.  HENT:     Head: Normocephalic and atraumatic.     Comments: No gross deformity of the head present no raccoon eyes or Battle sign noted.    Nose: No congestion.     Mouth/Throat:     Mouth: Mucous membranes are moist.     Pharynx: Oropharynx is clear.  No oropharyngeal exudate or posterior oropharyngeal erythema.     Comments: No trismus no torticollis no oral trauma present. Eyes:     Extraocular Movements: Extraocular movements intact.     Conjunctiva/sclera: Conjunctivae normal.     Pupils: Pupils are equal, round, and reactive to light.  Cardiovascular:     Rate and Rhythm: Normal rate and regular rhythm.     Pulses: Normal pulses.     Heart sounds: No murmur heard.    No friction rub. No gallop.  Pulmonary:     Effort: No respiratory distress.     Breath sounds: No wheezing, rhonchi or rales.     Comments: No evidence of trauma on the chest chest is nontender. Abdominal:     Palpations: Abdomen is soft.     Tenderness: There is no abdominal tenderness. There is no right CVA tenderness or left CVA tenderness.  Musculoskeletal:     Comments: Spine was palpated was nontender to palpation no step-off deformities noted no pelvis instability no leg shortening.  Moving his upper and lower extremities.  Skin:    General: Skin is warm and dry.  Neurological:     Mental Status: He is alert.     Comments: No facial asymmetry no difficulty with word finding following two-step commands there is no unilateral weakness present.  Psychiatric:        Mood and Affect: Mood normal.     Procedures  Procedures  ED Course / MDM    Medical Decision Making Amount and/or Complexity of Data Reviewed Labs: ordered. Radiology: ordered.  Risk OTC drugs. Prescription drug management.    Lab Tests:  I Ordered, and personally interpreted labs.  The pertinent results include: CBC shows macrocytic anemia hemoglobin 10.4, CMP reveals potassium 3.2, CO2 of 20, ethanol 123, CBG is 106,   Imaging Studies ordered:  I ordered imaging studies including CT head I independently visualized and interpreted imaging which showed negative acute findings I agree with the radiologist interpretation   Cardiac Monitoring:  The patient was maintained  on a cardiac monitor.  I personally viewed and interpreted the cardiac monitored which showed an underlying rhythm of: EKG without signs of ischemia   Medicines ordered and prescription drug management:  I ordered medication including GI cocktail, Pepcid I have reviewed the patients home medicines and have made adjustments as needed  Critical Interventions:  N/A   Reevaluation:  Presents due to alcohol intoxication, on my evaluation patient is found asleep in bed, vital signs reassuring, he had a benign physical exam, having no complaints, will p.o. challenge and reassess.  On reassessment patient is tolerating p.o., I offered further assessment of the drop in his hemoglobin with Hemoccult, but patient deferred, states he would like to go home.  Patient is able to ambulate and urinate without difficulty he is tolerating p.o.   Consultations Obtained:  N/A    Test Considered:  CT chest abdomen pelvis-defers my suspicion for thoracic/abdominal trauma is low no evidence of trauma on my exam both which are nontender to palpation.    Rule out low suspicion for intracranial head bleed, no focal deficits present on my exam ct head is negative.  Low suspicion for spinal cord abnormality or spinal fracture spine was palpated was nontender to palpation, patient has full range of motion in the upper and lower extremities.  Suspicion for active GI bleed is low at this time as patient denies any bloody emesis or coffee-ground emesis denies any bloody stools or dark tarry stools, his abdomen is soft nontender, vital signs are reassuring.  He does have low hemoglobin suspect this is likely from chronic alcohol use.  I doubt bowel obstruction, ruptured stomach ulcer as abdomen is soft nontender tolerating p.o. still passing gas having normal bowel movements.   Dispostion and problem list  After consideration of the diagnostic results and the patients response to treatment, I feel that the  patent would benefit from discharge.  Alcohol intoxication-will have him follow-up with outpatient if he feels he needs help with alcohol consumption. Macrocytic anemia-patient was made aware this, will have him follow-up with PCP for further assessment.           Carroll Sage, PA-C 11/14/22 0013    Glynn Octave, MD 11/14/22 [phone]

## 2022-11-14 NOTE — ED Provider Notes (Incomplete)
  Physical Exam  BP 108/71   Pulse 79   Temp 98 F (36.7 C) (Oral)   Resp 15   SpO2 100%   Physical Exam  Procedures  Procedures  ED Course / MDM    Medical Decision Making Amount and/or Complexity of Data Reviewed Labs: ordered. Radiology: ordered.  Risk OTC drugs. Prescription drug management.   ***

## 2022-11-17 ENCOUNTER — Emergency Department (HOSPITAL_COMMUNITY)
Admission: EM | Admit: 2022-11-17 | Discharge: 2022-11-17 | Disposition: A | Discharge status: Home / Self Care | Payer: Medicaid Other | Attending: Emergency Medicine | Admitting: Emergency Medicine

## 2022-11-17 DIAGNOSIS — Y906 Blood alcohol level of 120-199 mg/100 ml: Secondary | ICD-10-CM | POA: Diagnosis not present

## 2022-11-17 DIAGNOSIS — F10920 Alcohol use, unspecified with intoxication, uncomplicated: Secondary | ICD-10-CM | POA: Insufficient documentation

## 2022-11-17 LAB — COMPREHENSIVE METABOLIC PANEL
ALT: 28 U/L (ref 0–44)
AST: 32 U/L (ref 15–41)
Albumin: 3.9 g/dL (ref 3.5–5.0)
Alkaline Phosphatase: 47 U/L (ref 38–126)
Anion gap: 10 (ref 5–15)
BUN: 15 mg/dL (ref 6–20)
CO2: 25 mmol/L (ref 22–32)
Calcium: 8.8 mg/dL — ABNORMAL LOW (ref 8.9–10.3)
Chloride: 104 mmol/L (ref 98–111)
Creatinine, Ser: 1.06 mg/dL (ref 0.61–1.24)
GFR, Estimated: >60 mL/min (ref >60)
Glucose, Bld: 145 mg/dL — ABNORMAL HIGH (ref 70–99)
Potassium: 3.6 mmol/L (ref 3.5–5.1)
Sodium: 139 mmol/L (ref 135–145)
Total Bilirubin: 0.6 mg/dL (ref 0.3–1.2)
Total Protein: 6.9 g/dL (ref 6.5–8.1)

## 2022-11-17 LAB — CBC WITH DIFFERENTIAL/PLATELET
Abs Immature Granulocytes: 0.01 10*3/uL (ref 0.00–0.07)
Basophils Absolute: 0 10*3/uL (ref 0.0–0.1)
Basophils Relative: 1 %
Eosinophils Absolute: 0.1 10*3/uL (ref 0.0–0.5)
Eosinophils Relative: 3 %
HCT: 36.7 % — ABNORMAL LOW (ref 39.0–52.0)
Hemoglobin: 12.4 g/dL — ABNORMAL LOW (ref 13.0–17.0)
Immature Granulocytes: 0 %
Lymphocytes Relative: 44 %
Lymphs Abs: 1.8 10*3/uL (ref 0.7–4.0)
MCH: 33 pg (ref 26.0–34.0)
MCHC: 33.8 g/dL (ref 30.0–36.0)
MCV: 97.6 fL (ref 80.0–100.0)
Monocytes Absolute: 0.5 10*3/uL (ref 0.1–1.0)
Monocytes Relative: 13 %
Neutro Abs: 1.6 10*3/uL — ABNORMAL LOW (ref 1.7–7.7)
Neutrophils Relative %: 39 %
Platelets: 305 10*3/uL (ref 150–400)
RBC: 3.76 MIL/uL — ABNORMAL LOW (ref 4.22–5.81)
RDW: 13.9 % (ref 11.5–15.5)
WBC: 4 10*3/uL (ref 4.0–10.5)
nRBC: 0 % (ref 0.0–0.2)

## 2022-11-17 LAB — RAPID URINE DRUG SCREEN, HOSP PERFORMED
Amphetamines: NOT DETECTED
Barbiturates: NOT DETECTED
Benzodiazepines: NOT DETECTED
Cocaine: POSITIVE — AB
Opiates: NOT DETECTED
Tetrahydrocannabinol: POSITIVE — AB

## 2022-11-17 LAB — ETHANOL: Alcohol, Ethyl (B): 198 mg/dL — ABNORMAL HIGH (ref <10)

## 2022-11-17 MED ORDER — NALOXONE HCL 0.4 MG/ML IJ SOLN
0.4000 mg | Freq: Once | INTRAMUSCULAR | Status: AC
  Administered 2022-11-17: 0.4 mg via INTRAVENOUS
  Filled 2022-11-17: qty 1

## 2022-11-17 MED ORDER — LACTATED RINGERS IV BOLUS
1000.0000 mL | Freq: Once | INTRAVENOUS | Status: AC
  Administered 2022-11-17: 1000 mL via INTRAVENOUS

## 2022-11-17 NOTE — ED Triage Notes (Signed)
BIBA for alcohol intoxication, 2 empty bud light bottles found by pt in [employer] parking lot. Ambulated to stretcher, unresponsive to verbal, responds to painful stimuli and walked to stretcher per EMS.

## 2022-11-17 NOTE — Discharge Instructions (Signed)
You were seen in the emergency department for your alcohol intoxication.  You had no signs of injury or severe dehydration.  You can follow-up with the primary care clinic as needed or at the behavioral health urgent care if you would like help with your alcohol use.  You should return to the emergency department if you have any injuries, you have signs of alcohol withdrawal with shakes or seizures, repetitive vomiting or if you have any other new or concerning symptoms.

## 2022-11-17 NOTE — ED Provider Notes (Signed)
Patient is fully alert and oriented.  He is able to ambulate.  He is drinking without any vomiting.  He was discharged in good condition.   Rolan Bucco, MD 11/17/22 [phone]

## 2022-11-17 NOTE — ED Provider Notes (Signed)
[place] EMERGENCY DEPARTMENT AT [facility] Provider Note   CSN: [id number] Arrival date & time: 11/17/22  1238     History  Chief Complaint  Patient presents with   Alcohol Intoxication    David Burnett is a 32 y.o. male.  Patient is a 32 year old male with a past medical history of alcohol use disorder presenting to the emergency department with alcohol intoxication.  Per EMS, the patient was found in a parking lot surrounded by alcohol bottles.  He states that he is drowsy but was able to ambulate to the stretcher and has been responding to noxious stimuli.  They state that he has a known history of alcohol use disorder but further history is limited due to patient's intoxicated state.  The history is provided by the EMS personnel. The history is limited by the condition of the patient.  Alcohol Intoxication       Home Medications Prior to Admission medications   Medication Sig Start Date End Date Taking? Authorizing Provider  chlordiazePOXIDE (LIBRIUM) 25 MG capsule 50mg  PO TID x 1D, then 25-50mg  PO BID X 1D, then 25-50mg  PO QD X 1D 11/13/22   Fondaw, Wylder S, PA  famotidine (PEPCID) 20 MG tablet Take 1 tablet (20 mg total) by mouth 2 (two) times daily as needed for heartburn or indigestion (chest pain). 11/13/22   Gailen Shelter, PA  ondansetron (ZOFRAN-ODT) 4 MG disintegrating tablet Take 1 tablet (4 mg total) by mouth every 8 (eight) hours as needed for nausea or vomiting. 01/27/22   Petrucelli, Pleas Koch, PA-C      Allergies    Patient has no known allergies.    Review of Systems   Review of Systems  Physical Exam Updated Vital Signs BP (!) 103/53   Pulse 80   Temp (!) 97 F (36.1 C) (Axillary) Comment: nurse made aware and pt not cooperative  Resp 16   SpO2 100%  Physical Exam Vitals and nursing note reviewed.  Constitutional:      Comments: Somnolent, arousable to noxious stimuli and quickly falls asleep  HENT:     Head: Normocephalic  and atraumatic.     Nose: Nose normal.     Mouth/Throat:     Mouth: Mucous membranes are moist.     Pharynx: Oropharynx is clear.  Eyes:     Comments: Pinpoint bilateral pupils  Cardiovascular:     Rate and Rhythm: Normal rate and regular rhythm.     Heart sounds: Normal heart sounds.  Pulmonary:     Effort: Pulmonary effort is normal.     Breath sounds: Normal breath sounds.  Abdominal:     General: Abdomen is flat.     Palpations: Abdomen is soft.     Tenderness: There is no abdominal tenderness.  Musculoskeletal:        General: No tenderness or deformity.     Cervical back: Neck supple.     Right lower leg: No edema.     Left lower leg: No edema.  Skin:    General: Skin is warm and dry.     Findings: No bruising.  Neurological:     Comments: Localizing pain and withdrawing in all 4 extremities, moaning to noxious stimuli, not opening eyes     ED Results / Procedures / Treatments   Labs (all labs ordered are listed, but only abnormal results are displayed) Labs Reviewed  CBC WITH DIFFERENTIAL/PLATELET - Abnormal; Notable for the following components:  Result Value   RBC 3.76 (*)    Hemoglobin 12.4 (*)    HCT 36.7 (*)    Neutro Abs 1.6 (*)    All other components within normal limits  COMPREHENSIVE METABOLIC PANEL - Abnormal; Notable for the following components:   Glucose, Bld 145 (*)    Calcium 8.8 (*)    All other components within normal limits  ETHANOL - Abnormal; Notable for the following components:   Alcohol, Ethyl (B) 198 (*)    All other components within normal limits  RAPID URINE DRUG SCREEN, HOSP PERFORMED    EKG EKG Interpretation Date/Time:  Monday November 17 2022 12:47:27 EDT Ventricular Rate:  89 PR Interval:  151 QRS Duration:  88 QT Interval:  355 QTC Calculation: 432 R Axis:   71  Text Interpretation: Sinus rhythm ST elev, probable normal early repol pattern No significant change since last tracing Confirmed by Elayne Snare  ([id number]) on 11/17/2022 12:50:29 PM  Radiology No results found.  Procedures Procedures    Medications Ordered in ED Medications  naloxone [facility]) injection 0.4 mg (0.4 mg Intravenous Given 11/17/22 1320)  lactated ringers bolus 1,000 mL (0 mLs Intravenous Stopped 11/17/22 1433)    ED Course/ Medical Decision Making/ A&P Clinical Course as of 11/17/22 1533  Mon Nov 17, 2022  1352 No change in symptoms with narcan. ETOH level 198, otherwise within normal range. Will continue to be monitored until sobriety. [VK]  1531 Patient was signed out to Dr. Fredderick Phenix in stable condition pending sobriety. [VK]    Clinical Course User Index [VK] Rexford Maus, DO                             Medical Decision Making This patient presents to the ED with chief complaint(s) of intoxication with pertinent past medical history of ETOH use which further complicates the presenting complaint. The complaint involves an extensive differential diagnosis and also carries with it a high risk of complications and morbidity.    The differential diagnosis includes alcohol intoxication polysubstance use, hypo or hyperglycemia, dehydration, electrolyte derangements, no evidence of trauma on exam making ICH or mass effect unlikely  Additional history obtained: Additional history obtained from EMS  Records reviewed outside ED records  ED Course and Reassessment: On patient's arrival to the emergency department he is somnolent but arousable and withdrawing in all 4 extremities to noxious stimulation.  He is satting well on room air and breathing appropriately but does have pinpoint pupils and will trial dose of Narcan.  The patient will have labs performed to evaluate for electrolyte derangements.  EKG on arrival showed normal sinus rhythm without acute ischemic changes.  He will be closely monitored for sobriety.  Independent labs interpretation:  The following labs were independently interpreted: elevated ETOH,  otherwise within normal range  Independent visualization of imaging: - N/A  Consultation: - Consulted or discussed management/test interpretation w/ external professional: N/A  Social Determinants of health: ETOH use disorder    Amount and/or Complexity of Data Reviewed Labs: ordered.  Risk Prescription drug management.          Final Clinical Impression(s) / ED Diagnoses Final diagnoses:  Alcoholic intoxication without complication [facility])    Rx / DC Orders ED Discharge Orders     None         Rexford Maus, DO 11/17/22 1533

## 2022-11-18 ENCOUNTER — Emergency Department (HOSPITAL_COMMUNITY): Payer: Medicaid Other

## 2022-11-18 ENCOUNTER — Emergency Department (HOSPITAL_COMMUNITY)
Admission: EM | Admit: 2022-11-18 | Discharge: 2022-11-18 | Disposition: A | Discharge status: Home / Self Care | Payer: Medicaid Other | Attending: Emergency Medicine | Admitting: Emergency Medicine

## 2022-11-18 DIAGNOSIS — Y908 Blood alcohol level of 240 mg/100 ml or more: Secondary | ICD-10-CM | POA: Insufficient documentation

## 2022-11-18 DIAGNOSIS — F10920 Alcohol use, unspecified with intoxication, uncomplicated: Secondary | ICD-10-CM | POA: Insufficient documentation

## 2022-11-18 LAB — ETHANOL: Alcohol, Ethyl (B): 297 mg/dL — ABNORMAL HIGH (ref <10)

## 2022-11-18 LAB — BASIC METABOLIC PANEL
Anion gap: 8 (ref 5–15)
BUN: 11 mg/dL (ref 6–20)
CO2: 26 mmol/L (ref 22–32)
Calcium: 8.8 mg/dL — ABNORMAL LOW (ref 8.9–10.3)
Chloride: 106 mmol/L (ref 98–111)
Creatinine, Ser: 1.02 mg/dL (ref 0.61–1.24)
GFR, Estimated: >60 mL/min (ref >60)
Glucose, Bld: 90 mg/dL (ref 70–99)
Potassium: 3.5 mmol/L (ref 3.5–5.1)
Sodium: 140 mmol/L (ref 135–145)

## 2022-11-18 LAB — CBC
HCT: 40.3 % (ref 39.0–52.0)
Hemoglobin: 13.1 g/dL (ref 13.0–17.0)
MCH: 32.5 pg (ref 26.0–34.0)
MCHC: 32.5 g/dL (ref 30.0–36.0)
MCV: 100 fL (ref 80.0–100.0)
Platelets: 299 10*3/uL (ref 150–400)
RBC: 4.03 MIL/uL — ABNORMAL LOW (ref 4.22–5.81)
RDW: 14.2 % (ref 11.5–15.5)
WBC: 3.9 10*3/uL — ABNORMAL LOW (ref 4.0–10.5)
nRBC: 0 % (ref 0.0–0.2)

## 2022-11-18 MED ORDER — SODIUM CHLORIDE 0.9 % IV SOLN
1000.0000 mL | INTRAVENOUS | Status: DC
  Administered 2022-11-18: 1000 mL via INTRAVENOUS

## 2022-11-18 MED ORDER — SODIUM CHLORIDE 0.9 % IV BOLUS (SEPSIS)
1000.0000 mL | Freq: Once | INTRAVENOUS | Status: AC
  Administered 2022-11-18: 1000 mL via INTRAVENOUS

## 2022-11-18 MED ORDER — THIAMINE HCL 100 MG/ML IJ SOLN
100.0000 mg | Freq: Once | INTRAMUSCULAR | Status: AC
  Administered 2022-11-18: 100 mg via INTRAVENOUS
  Filled 2022-11-18: qty 2

## 2022-11-18 NOTE — ED Triage Notes (Signed)
Arrived by ems found passed out. EMS stated Pinpoint pupils upon arrival, no narcan given. 113/70 initial blood pressure and CBG 105.

## 2022-11-18 NOTE — ED Notes (Signed)
Attempted to get pt temperature and pt was uncooperative. Will re attempt at a later time

## 2022-11-18 NOTE — Discharge Instructions (Signed)
Please review the discharge instructions.  It has information about outpatient treatment centers to help you with your alcohol use

## 2022-11-18 NOTE — ED Notes (Signed)
Pt refused temp check

## 2022-11-18 NOTE — ED Provider Notes (Addendum)
[place] EMERGENCY DEPARTMENT AT [facility] Provider Note   CSN: [id number] Arrival date & time: 11/18/22  1526     History  Chief Complaint  Patient presents with   Alcohol Intoxication    David Burnett is a 32 y.o. male.   Alcohol Intoxication     Patient has a history of alcoholism and recurrent visits to the ED.  Patient was seen in the emergency room on June 27 as well as July 1 for alcohol intoxication.  Patient was found lying in a parking lot.  He was given Narcan without relief.  His blood sugar was checked and was normal.  Patient is not answering questions or following commands  Home Medications Prior to Admission medications   Medication Sig Start Date End Date Taking? Authorizing Provider  chlordiazePOXIDE (LIBRIUM) 25 MG capsule 50mg  PO TID x 1D, then 25-50mg  PO BID X 1D, then 25-50mg  PO QD X 1D 11/13/22   Fondaw, Wylder S, PA  famotidine (PEPCID) 20 MG tablet Take 1 tablet (20 mg total) by mouth 2 (two) times daily as needed for heartburn or indigestion (chest pain). 11/13/22   Gailen Shelter, PA  ondansetron (ZOFRAN-ODT) 4 MG disintegrating tablet Take 1 tablet (4 mg total) by mouth every 8 (eight) hours as needed for nausea or vomiting. 01/27/22   Petrucelli, Pleas Koch, PA-C      Allergies    Patient has no known allergies.    Review of Systems   Review of Systems  Physical Exam Updated Vital Signs BP 104/79   Pulse 88   Temp (!) 97.5 F (36.4 C) (Oral)   Resp 15   SpO2 98%  Physical Exam Vitals and nursing note reviewed.  Constitutional:      Appearance: He is well-developed. He is not diaphoretic.  HENT:     Head: Normocephalic and atraumatic.     Comments: Alcohol odor on the breath    Right Ear: External ear normal.     Left Ear: External ear normal.  Eyes:     General: No scleral icterus.       Right eye: No discharge.        Left eye: No discharge.     Conjunctiva/sclera: Conjunctivae normal.  Neck:     Trachea: No  tracheal deviation.  Cardiovascular:     Rate and Rhythm: Normal rate and regular rhythm.  Pulmonary:     Effort: Pulmonary effort is normal. No respiratory distress.     Breath sounds: Normal breath sounds. No stridor. No wheezing or rales.  Abdominal:     General: Bowel sounds are normal. There is no distension.     Palpations: Abdomen is soft.     Tenderness: There is no abdominal tenderness. There is no guarding or rebound.  Musculoskeletal:        General: No tenderness or deformity.     Cervical back: Neck supple.  Skin:    General: Skin is warm and dry.     Findings: No rash.  Neurological:     General: No focal deficit present.     Mental Status: He is alert.     Cranial Nerves: No facial asymmetry.     Motor: No abnormal muscle tone or seizure activity.     Comments: Patient not answering questions or following commands  Psychiatric:        Mood and Affect: Mood normal.     ED Results / Procedures / Treatments   Labs (all  labs ordered are listed, but only abnormal results are displayed) Labs Reviewed  ETHANOL - Abnormal; Notable for the following components:      Result Value   Alcohol, Ethyl (B) 297 (*)    All other components within normal limits  BASIC METABOLIC PANEL - Abnormal; Notable for the following components:   Calcium 8.8 (*)    All other components within normal limits  CBC - Abnormal; Notable for the following components:   WBC 3.9 (*)    RBC 4.03 (*)    All other components within normal limits    EKG None  Radiology CT Head Wo Contrast  Result Date: 11/18/2022 CLINICAL DATA:  32 year old male with altered mental status. EXAM: CT HEAD WITHOUT CONTRAST TECHNIQUE: Contiguous axial images were obtained from the base of the skull through the vertex without intravenous contrast. RADIATION DOSE REDUCTION: This exam was performed according to the departmental dose-optimization program which includes automated exposure control, adjustment of the mA  and/or kV according to patient size and/or use of iterative reconstruction technique. COMPARISON:  11/13/2022 and prior studies FINDINGS: Brain: No evidence of acute infarction, hemorrhage, hydrocephalus, extra-axial collection or mass lesion/mass effect. Mild atrophy again noted. Vascular: No hyperdense vessel or unexpected calcification. Skull: Normal. Negative for fracture or focal lesion. Sinuses/Orbits: No acute finding. Other: None. IMPRESSION: 1. No evidence of acute intracranial abnormality. 2. Mild atrophy. Electronically Signed   By: Harmon Pier M.D.   On: 11/18/2022 16:38    Procedures Procedures    Medications Ordered in ED Medications  sodium chloride 0.9 % bolus 1,000 mL (0 mLs Intravenous Stopped 11/18/22 1826)    Followed by  0.9 %  sodium chloride infusion (0 mLs Intravenous Stopped 11/18/22 2242)  thiamine (VITAMIN B1) injection 100 mg (100 mg Intravenous Given 11/18/22 1701)    ED Course/ Medical Decision Making/ A&P Clinical Course as of 11/18/22 2245  Tue Nov 18, 2022  1801 Labs reviewed.  Alcohol level elevated to 97.  Metabolic panel unremarkable. [JK]  1801 CT scan without acute findings [JK]  2004 Patient is still somnolent [JK]  2244 Patient is now awake and alert.  He is ready for discharge [JK]    Clinical Course User Index [JK] Linwood Dibbles, MD                             Medical Decision Making Differential diagnosis includes but not limited to alcohol intoxication, metabolic abnormality, cerebral hemorrhage subdural hematoma  Problems Addressed: Alcoholic intoxication without complication [facility]): acute illness or injury that poses a threat to life or bodily functions  Amount and/or Complexity of Data Reviewed Labs: ordered. Decision-making details documented in ED Course. Radiology: ordered and independent interpretation performed.  Risk Prescription drug management.   Patient has recurrent alcohol intoxication.  Presents to the ED after being found  unresponsive in a parking lot.  No obvious signs of trauma.  Head CT does not show any acute abnormality.  Patient's labs are unremarkable with exception of severe alcohol intoxication with an ethanol level of 297.  Patient has been treated with IV fluids.  Will continue to monitor until he is clinically sober.  Will encourage alcohol cessation, outpatient follow-up with a treatment center        Final Clinical Impression(s) / ED Diagnoses Final diagnoses:  Alcoholic intoxication without complication (HCC)    Rx / DC Orders ED Discharge Orders     None  Linwood Dibbles, MD 11/18/22 2245

## 2022-12-10 ENCOUNTER — Other Ambulatory Visit: Payer: Self-pay

## 2022-12-10 ENCOUNTER — Encounter (HOSPITAL_COMMUNITY): Payer: Self-pay

## 2022-12-10 ENCOUNTER — Emergency Department (HOSPITAL_COMMUNITY)
Admission: EM | Admit: 2022-12-10 | Discharge: 2022-12-11 | Disposition: A | Discharge status: Home / Self Care | Payer: BLUE CROSS/BLUE SHIELD | Attending: Emergency Medicine | Admitting: Emergency Medicine

## 2022-12-10 DIAGNOSIS — Y908 Blood alcohol level of 240 mg/100 ml or more: Secondary | ICD-10-CM | POA: Insufficient documentation

## 2022-12-10 DIAGNOSIS — Y907 Blood alcohol level of 200-239 mg/100 ml: Secondary | ICD-10-CM | POA: Insufficient documentation

## 2022-12-10 DIAGNOSIS — R4182 Altered mental status, unspecified: Secondary | ICD-10-CM | POA: Insufficient documentation

## 2022-12-10 DIAGNOSIS — F10929 Alcohol use, unspecified with intoxication, unspecified: Secondary | ICD-10-CM

## 2022-12-10 DIAGNOSIS — F10220 Alcohol dependence with intoxication, uncomplicated: Secondary | ICD-10-CM | POA: Insufficient documentation

## 2022-12-10 DIAGNOSIS — T40711A Poisoning by cannabis, accidental (unintentional), initial encounter: Secondary | ICD-10-CM | POA: Insufficient documentation

## 2022-12-10 DIAGNOSIS — T50901A Poisoning by unspecified drugs, medicaments and biological substances, accidental (unintentional), initial encounter: Secondary | ICD-10-CM

## 2022-12-10 LAB — CBC
HCT: 38.5 % — ABNORMAL LOW (ref 39.0–52.0)
Hemoglobin: 12.7 g/dL — ABNORMAL LOW (ref 13.0–17.0)
MCH: 32.3 pg (ref 26.0–34.0)
MCHC: 33 g/dL (ref 30.0–36.0)
MCV: 98 fL (ref 80.0–100.0)
Platelets: 287 10*3/uL (ref 150–400)
RBC: 3.93 MIL/uL — ABNORMAL LOW (ref 4.22–5.81)
RDW: 13.6 % (ref 11.5–15.5)
WBC: 4.1 10*3/uL (ref 4.0–10.5)
nRBC: 0 % (ref 0.0–0.2)

## 2022-12-10 LAB — COMPREHENSIVE METABOLIC PANEL
ALT: 24 U/L (ref 0–44)
AST: 33 U/L (ref 15–41)
Albumin: 3.6 g/dL (ref 3.5–5.0)
Alkaline Phosphatase: 44 U/L (ref 38–126)
Anion gap: 12 (ref 5–15)
BUN: 13 mg/dL (ref 6–20)
CO2: 24 mmol/L (ref 22–32)
Calcium: 9.1 mg/dL (ref 8.9–10.3)
Chloride: 103 mmol/L (ref 98–111)
Creatinine, Ser: 1.01 mg/dL (ref 0.61–1.24)
GFR, Estimated: >60 mL/min (ref >60)
Glucose, Bld: 121 mg/dL — ABNORMAL HIGH (ref 70–99)
Potassium: 3.1 mmol/L — ABNORMAL LOW (ref 3.5–5.1)
Sodium: 139 mmol/L (ref 135–145)
Total Bilirubin: 0.1 mg/dL — ABNORMAL LOW (ref 0.3–1.2)
Total Protein: 6.2 g/dL — ABNORMAL LOW (ref 6.5–8.1)

## 2022-12-10 LAB — AMMONIA: Ammonia: 57 umol/L — ABNORMAL HIGH (ref 9–35)

## 2022-12-10 LAB — ETHANOL: Alcohol, Ethyl (B): 212 mg/dL — ABNORMAL HIGH (ref <10)

## 2022-12-10 MED ORDER — NALOXONE HCL 2 MG/2ML IJ SOSY
2.0000 mg | PREFILLED_SYRINGE | Freq: Once | INTRAMUSCULAR | Status: AC
  Administered 2022-12-10: 2 mg via INTRAVENOUS
  Filled 2022-12-10: qty 2

## 2022-12-10 MED ORDER — SODIUM CHLORIDE 0.9 % IV BOLUS
1000.0000 mL | Freq: Once | INTRAVENOUS | Status: AC
  Administered 2022-12-10: 1000 mL via INTRAVENOUS

## 2022-12-10 NOTE — ED Notes (Signed)
Unable to give urine specimen at this time , attempted to assist using a urinal . Patient went back to sleep . Urinal set up at bedside .

## 2022-12-10 NOTE — ED Triage Notes (Signed)
Pt BIB EMS due to drug overdose. Pt found in [employer] bathroom. Unknown what drugs were taken by pt. Pt unresponsive. Hypotensive with EMS. Pt responds to pain. Pt has gag reflex. 1mg  of narcan given.

## 2022-12-10 NOTE — ED Notes (Signed)
Patient awake , alert and oriented , respirations unlabored , denies pain EDP notified .

## 2022-12-10 NOTE — ED Provider Notes (Signed)
[place] EMERGENCY DEPARTMENT AT [facility] Provider Note   CSN: [id number] Arrival date & time: 12/10/22  1552     History  Chief Complaint  Patient presents with   Drug Overdose    David Burnett is a 32 y.o. male.   Drug Overdose  Patient brought in from [employer] bathroom.  Found unresponsive.  Thought to be overdose.  Given Narcan by EMS.  Reportedly at 1 mg with little improvement.  Mild hypotension.  Reviewing notes has had previous visits to the ER for similar symptoms.  History of alcohol use.  Drug screen is previously showed cocaine and marijuana.     Home Medications Prior to Admission medications   Medication Sig Start Date End Date Taking? Authorizing Provider  chlordiazePOXIDE (LIBRIUM) 25 MG capsule 50mg  PO TID x 1D, then 25-50mg  PO BID X 1D, then 25-50mg  PO QD X 1D 11/13/22   Fondaw, Wylder S, PA  famotidine (PEPCID) 20 MG tablet Take 1 tablet (20 mg total) by mouth 2 (two) times daily as needed for heartburn or indigestion (chest pain). 11/13/22   Gailen Shelter, PA  ondansetron (ZOFRAN-ODT) 4 MG disintegrating tablet Take 1 tablet (4 mg total) by mouth every 8 (eight) hours as needed for nausea or vomiting. 01/27/22   Petrucelli, Pleas Koch, PA-C      Allergies    Patient has no known allergies.    Review of Systems   Review of Systems  Physical Exam Updated Vital Signs BP (!) 109/57   Pulse 77   Temp (!) 97.4 F (36.3 C) (Oral)   Resp 16   SpO2 100%  Physical Exam Vitals and nursing note reviewed.  Eyes:     Comments: Pupils pinpoint.  Cardiovascular:     Rate and Rhythm: Regular rhythm.  Pulmonary:     Breath sounds: No wheezing.  Abdominal:     Tenderness: There is no abdominal tenderness.  Musculoskeletal:        General: No tenderness.     Cervical back: Neck supple.  Skin:    General: Skin is warm.  Neurological:     Comments: Will respond somewhat to pain.  Pupils constricted.     ED Results / Procedures  / Treatments   Labs (all labs ordered are listed, but only abnormal results are displayed) Labs Reviewed  COMPREHENSIVE METABOLIC PANEL  ETHANOL  CBC  AMMONIA  RAPID URINE DRUG SCREEN, HOSP PERFORMED    EKG None  Radiology No results found.  Procedures Procedures    Medications Ordered in ED Medications  naloxone (NARCAN) injection 2 mg (2 mg Intravenous Given 12/10/22 1624)    ED Course/ Medical Decision Making/ A&P                             Medical Decision Making Amount and/or Complexity of Data Reviewed Labs: ordered.  Risk Prescription drug management.   Patient with decreased mental status.  Found unresponsive in bathroom.  Does have some history of substance use, particularly alcohol.  Will get basic blood work.  Pupils constricted which could go along with a opiate toxidrome.  Will give some more Narcan to evaluate opiate as a cause since he is rather sedate.  Blood work reassuring.  Now his woken up some but has more hypotension.  States he feels dizzy.  Will give another fluid bolus.        Final Clinical Impression(s) / ED Diagnoses  Final diagnoses:  None    Rx / DC Orders ED Discharge Orders     None         Benjiman Core, MD 12/10/22 2327

## 2022-12-11 ENCOUNTER — Encounter (HOSPITAL_COMMUNITY): Payer: Self-pay

## 2022-12-11 ENCOUNTER — Emergency Department (HOSPITAL_COMMUNITY)
Admission: EM | Admit: 2022-12-11 | Discharge: 2022-12-11 | Disposition: A | Discharge status: Home / Self Care | Payer: BLUE CROSS/BLUE SHIELD | Source: Home / Self Care | Attending: Emergency Medicine | Admitting: Emergency Medicine

## 2022-12-11 DIAGNOSIS — F10929 Alcohol use, unspecified with intoxication, unspecified: Secondary | ICD-10-CM | POA: Insufficient documentation

## 2022-12-11 DIAGNOSIS — Y908 Blood alcohol level of 240 mg/100 ml or more: Secondary | ICD-10-CM | POA: Insufficient documentation

## 2022-12-11 DIAGNOSIS — F10920 Alcohol use, unspecified with intoxication, uncomplicated: Secondary | ICD-10-CM

## 2022-12-11 HISTORY — DX: Alcohol abuse, uncomplicated: F10.10

## 2022-12-11 LAB — CBC
HCT: 38.5 % — ABNORMAL LOW (ref 39.0–52.0)
Hemoglobin: 12.9 g/dL — ABNORMAL LOW (ref 13.0–17.0)
MCH: 32.7 pg (ref 26.0–34.0)
MCHC: 33.5 g/dL (ref 30.0–36.0)
MCV: 97.5 fL (ref 80.0–100.0)
Platelets: 261 10*3/uL (ref 150–400)
RBC: 3.95 MIL/uL — ABNORMAL LOW (ref 4.22–5.81)
RDW: 13.6 % (ref 11.5–15.5)
WBC: 3.9 10*3/uL — ABNORMAL LOW (ref 4.0–10.5)
nRBC: 0 % (ref 0.0–0.2)

## 2022-12-11 LAB — RAPID URINE DRUG SCREEN, HOSP PERFORMED
Amphetamines: NOT DETECTED
Amphetamines: NOT DETECTED
Barbiturates: NOT DETECTED
Barbiturates: NOT DETECTED
Benzodiazepines: NOT DETECTED
Benzodiazepines: NOT DETECTED
Cocaine: NOT DETECTED
Cocaine: NOT DETECTED
Opiates: NOT DETECTED
Opiates: NOT DETECTED
Tetrahydrocannabinol: NOT DETECTED
Tetrahydrocannabinol: POSITIVE — AB

## 2022-12-11 LAB — COMPREHENSIVE METABOLIC PANEL
ALT: 33 U/L (ref 0–44)
AST: 65 U/L — ABNORMAL HIGH (ref 15–41)
Albumin: 3.8 g/dL (ref 3.5–5.0)
Alkaline Phosphatase: 40 U/L (ref 38–126)
Anion gap: 9 (ref 5–15)
BUN: 13 mg/dL (ref 6–20)
CO2: 25 mmol/L (ref 22–32)
Calcium: 8.6 mg/dL — ABNORMAL LOW (ref 8.9–10.3)
Chloride: 106 mmol/L (ref 98–111)
Creatinine, Ser: 0.79 mg/dL (ref 0.61–1.24)
GFR, Estimated: >60 mL/min (ref >60)
Glucose, Bld: 93 mg/dL (ref 70–99)
Potassium: 2.9 mmol/L — ABNORMAL LOW (ref 3.5–5.1)
Sodium: 140 mmol/L (ref 135–145)
Total Bilirubin: 0.7 mg/dL (ref 0.3–1.2)
Total Protein: 6.8 g/dL (ref 6.5–8.1)

## 2022-12-11 LAB — ETHANOL: Alcohol, Ethyl (B): 381 mg/dL (ref <10)

## 2022-12-11 MED ORDER — NALOXONE HCL 0.4 MG/ML IJ SOLN
INTRAMUSCULAR | Status: AC
  Filled 2022-12-11: qty 1

## 2022-12-11 MED ORDER — MAGNESIUM SULFATE 2 GM/50ML IV SOLN
2.0000 g | Freq: Once | INTRAVENOUS | Status: AC
  Administered 2022-12-11: 2 g via INTRAVENOUS
  Filled 2022-12-11: qty 50

## 2022-12-11 MED ORDER — LACTATED RINGERS IV BOLUS
1000.0000 mL | Freq: Once | INTRAVENOUS | Status: AC
  Administered 2022-12-11: 1000 mL via INTRAVENOUS

## 2022-12-11 MED ORDER — THIAMINE HCL 100 MG/ML IJ SOLN
100.0000 mg | Freq: Once | INTRAMUSCULAR | Status: AC
  Administered 2022-12-11: 100 mg via INTRAVENOUS
  Filled 2022-12-11: qty 2

## 2022-12-11 MED ORDER — THIAMINE HCL 100 MG PO TABS: 100.0000 mg | ORAL_TABLET | Freq: Every day | ORAL | 0 refills | Status: AC

## 2022-12-11 MED ORDER — NALOXONE HCL 0.4 MG/ML IJ SOLN
0.4000 mg | Freq: Once | INTRAMUSCULAR | Status: AC
  Administered 2022-12-11: 0.4 mg via INTRAVENOUS

## 2022-12-11 MED ORDER — POTASSIUM CHLORIDE 10 MEQ/100ML IV SOLN
10.0000 meq | INTRAVENOUS | Status: AC
  Administered 2022-12-11 (×2): 10 meq via INTRAVENOUS
  Filled 2022-12-11 (×2): qty 100

## 2022-12-11 NOTE — ED Notes (Signed)
Pt removed IV for the second time. Provider notified that pt did not receive full amount of 2nd bag of Potassium

## 2022-12-11 NOTE — ED Notes (Signed)
Pt is sleeping, respirations even and unlabored

## 2022-12-11 NOTE — ED Notes (Signed)
Noted BP trending 90s/40s. Pt heavily asleep. Will continue to monitor. RN aware

## 2022-12-11 NOTE — ED Notes (Signed)
Pt has black book bag that was placed in cabinet over 5-8 nursing assignment

## 2022-12-11 NOTE — ED Notes (Signed)
Pt asleep att. Will hold obtaining temp. RN Angelica Chessman aware

## 2022-12-11 NOTE — ED Notes (Signed)
Pt ambulatory to restroom and back to bed. [place] sandwich and water provided. Pt states he is "still sleepy"

## 2022-12-11 NOTE — ED Triage Notes (Signed)
BIBA from [employer] on [address] for potential OD, admits only to drinking 7 airplane bottles of liquor. Pt responds to painful stimuli.

## 2022-12-11 NOTE — ED Provider Notes (Signed)
  Physical Exam  BP 119/71 (BP Location: Right Arm)   Pulse 87   Temp 97.6 F (36.4 C) (Temporal)   Resp 20   SpO2 100%   Physical Exam Constitutional:      General: He is not in acute distress.    Appearance: Normal appearance.  HENT:     Head: Normocephalic and atraumatic.     Nose: No congestion or rhinorrhea.  Eyes:     General:        Right eye: No discharge.        Left eye: No discharge.     Extraocular Movements: Extraocular movements intact.     Pupils: Pupils are equal, round, and reactive to light.  Cardiovascular:     Rate and Rhythm: Normal rate and regular rhythm.     Heart sounds: No murmur heard. Pulmonary:     Effort: No respiratory distress.     Breath sounds: No wheezing or rales.  Abdominal:     General: There is no distension.     Tenderness: There is no abdominal tenderness.  Musculoskeletal:        General: Normal range of motion.     Cervical back: Normal range of motion.  Skin:    General: Skin is warm and dry.  Neurological:     General: No focal deficit present.     Mental Status: He is alert.     Procedures  Procedures  ED Course / MDM    Medical Decision Making Amount and/or Complexity of Data Reviewed Labs: ordered.  Risk Prescription drug management.   Patient received an handoff.  Altered mental status and concern for overdose.  No response to Narcan on previous shift.  Workup concerning for alcohol intoxication.  At time of note, patient pending metabolization of underlying alcohol.  On my reevaluation, patient is alert and oriented able to ambulate in the hallways without difficulty and use the restroom.  Patient able to tolerate p.o. without difficulty.  At this time, patient is clinically sober and I did inform him that he is not allowed to drive a vehicle or operate heavy machinery for at least 12 more hours.  At this time he does not meet inpatient criteria for admission he is safe for discharge with outpatient  follow-up.       Glendora Score, MD 12/11/22 [phone]

## 2022-12-11 NOTE — ED Notes (Addendum)
Pt is awake now, still very intoxicated, unable to stand on his own. Pt directed back to bed, bed alarm set. Sprite and orange juice provided per pt request

## 2022-12-11 NOTE — ED Provider Notes (Signed)
Care of patient received from prior provider at 9:30 PM, please see their note for complete H/P and care plan.  Received handoff per ED course.  Clinical Course as of 12/11/22 2130  Thu Dec 11, 2022  1503 Stable Endorsed 7 alcohol bottles prior to arrival.  Aggressive on arrival observed reassessed frequently.  After 7 hours of observation, mental status is improving.  Labs consistent with ethanol overdose.  Required frequent reorientation and de-escalation of aggression.  Treated with thiamine mag, LR, potassium with interval improvement now ambulatory tolerating p.o. intake. [CC]  1727 Alcohol, Ethyl (B)(!!): 381 [CC]    Clinical Course User Index [CC] Glyn Ade, MD    Per nursing, his ride is coming to get him.  Patient discharged with no further acute events.   Glyn Ade, MD 12/11/22 2308

## 2022-12-11 NOTE — ED Provider Notes (Signed)
[place] EMERGENCY DEPARTMENT AT [facility] Provider Note   CSN: [id number] Arrival date & time: 12/11/22  1300     History  Chief Complaint  Patient presents with   Alcohol Intoxication    David Burnett is a 32 y.o. male.  32 year old male presents after being found intoxicated [place] a gas station.  Review of old chart shows that he was just here for similar symptoms.  They have found small bowel liquor on his person.  He is currently unable to give a history.  Patient able to protect his airway at this time       Home Medications Prior to Admission medications   Medication Sig Start Date End Date Taking? Authorizing Provider  chlordiazePOXIDE (LIBRIUM) 25 MG capsule 50mg  PO TID x 1D, then 25-50mg  PO BID X 1D, then 25-50mg  PO QD X 1D 11/13/22   Fondaw, Wylder S, PA  famotidine (PEPCID) 20 MG tablet Take 1 tablet (20 mg total) by mouth 2 (two) times daily as needed for heartburn or indigestion (chest pain). 11/13/22   Fondaw, Rodrigo Ran, PA  ondansetron (ZOFRAN-ODT) 4 MG disintegrating tablet Take 1 tablet (4 mg total) by mouth every 8 (eight) hours as needed for nausea or vomiting. Patient not taking: Reported on 12/10/2022 01/27/22   Petrucelli, Pleas Koch, PA-C      Allergies    Patient has no known allergies.    Review of Systems   Review of Systems  Unable to perform ROS: Acuity of condition    Physical Exam Updated Vital Signs BP 114/73 (BP Location: Left Arm)   Pulse 64   Temp 97.7 F (36.5 C) (Oral)   Resp 17   SpO2 97%  Physical Exam Vitals and nursing note reviewed.  Constitutional:      General: He is not in acute distress.    Appearance: Normal appearance. He is well-developed. He is not toxic-appearing.  HENT:     Head: Normocephalic and atraumatic.  Eyes:     General: Lids are normal.     Conjunctiva/sclera: Conjunctivae normal.     Pupils: Pupils are equal, round, and reactive to light.     Comments: Pupils are pinpoint bilateral   Neck:     Thyroid: No thyroid mass.     Trachea: No tracheal deviation.  Cardiovascular:     Rate and Rhythm: Normal rate and regular rhythm.     Heart sounds: Normal heart sounds. No murmur heard.    No gallop.  Pulmonary:     Effort: Pulmonary effort is normal. No respiratory distress.     Breath sounds: Normal breath sounds. No stridor. No decreased breath sounds, wheezing, rhonchi or rales.  Abdominal:     General: There is no distension.     Palpations: Abdomen is soft.     Tenderness: There is no abdominal tenderness. There is no rebound.  Musculoskeletal:        General: No tenderness. Normal range of motion.     Cervical back: Normal range of motion and neck supple.  Skin:    General: Skin is warm and dry.     Findings: No abrasion or rash.  Neurological:     Mental Status: He is lethargic.     Cranial Nerves: Cranial nerves are intact. No cranial nerve deficit.  Psychiatric:        Attention and Perception: He is inattentive.     ED Results / Procedures / Treatments   Labs (all labs ordered are  listed, but only abnormal results are displayed) Labs Reviewed  CBC - Abnormal; Notable for the following components:      Result Value   WBC 3.9 (*)    RBC 3.95 (*)    Hemoglobin 12.9 (*)    HCT 38.5 (*)    All other components within normal limits  COMPREHENSIVE METABOLIC PANEL  ETHANOL  RAPID URINE DRUG SCREEN, HOSP PERFORMED    EKG None  Radiology No results found.  Procedures Procedures    Medications Ordered in ED Medications  naloxone (NARCAN) 0.4 MG/ML injection (has no administration in time range)  naloxone [facility]) injection 0.4 mg (0.4 mg Intravenous Given 12/11/22 1334)    ED Course/ Medical Decision Making/ A&P                             Medical Decision Making Amount and/or Complexity of Data Reviewed Labs: ordered.  Risk Prescription drug management.   Patient given Narcan due to pinpoint pupils concern for possible opiate  overdose.  His alcohol level was 381.  He is maintaining his airway at this time.  Will be allowed to metabolize.  Will sign out to next provider        Final Clinical Impression(s) / ED Diagnoses Final diagnoses:  None    Rx / DC Orders ED Discharge Orders     None         Lorre Nick, MD 12/11/22 1422

## 2023-01-04 ENCOUNTER — Other Ambulatory Visit: Payer: Self-pay

## 2023-01-04 ENCOUNTER — Encounter (HOSPITAL_COMMUNITY): Payer: Self-pay

## 2023-01-04 ENCOUNTER — Emergency Department (HOSPITAL_COMMUNITY)
Admission: EM | Admit: 2023-01-04 | Discharge: 2023-01-04 | Disposition: A | Discharge status: Home / Self Care | Payer: BLUE CROSS/BLUE SHIELD | Source: Home / Self Care | Attending: Emergency Medicine | Admitting: Emergency Medicine

## 2023-01-04 DIAGNOSIS — Y908 Blood alcohol level of 240 mg/100 ml or more: Secondary | ICD-10-CM | POA: Insufficient documentation

## 2023-01-04 DIAGNOSIS — F10920 Alcohol use, unspecified with intoxication, uncomplicated: Secondary | ICD-10-CM

## 2023-01-04 DIAGNOSIS — F10120 Alcohol abuse with intoxication, uncomplicated: Secondary | ICD-10-CM | POA: Diagnosis present

## 2023-01-04 LAB — CBC WITH DIFFERENTIAL/PLATELET
Abs Immature Granulocytes: 0 10*3/uL (ref 0.00–0.07)
Basophils Absolute: 0 10*3/uL (ref 0.0–0.1)
Basophils Relative: 1 %
Eosinophils Absolute: 0.2 10*3/uL (ref 0.0–0.5)
Eosinophils Relative: 4 %
HCT: 42 % (ref 39.0–52.0)
Hemoglobin: 14.3 g/dL (ref 13.0–17.0)
Immature Granulocytes: 0 %
Lymphocytes Relative: 49 %
Lymphs Abs: 2 10*3/uL (ref 0.7–4.0)
MCH: 32.8 pg (ref 26.0–34.0)
MCHC: 34 g/dL (ref 30.0–36.0)
MCV: 96.3 fL (ref 80.0–100.0)
Monocytes Absolute: 0.3 10*3/uL (ref 0.1–1.0)
Monocytes Relative: 8 %
Neutro Abs: 1.6 10*3/uL — ABNORMAL LOW (ref 1.7–7.7)
Neutrophils Relative %: 38 %
Platelets: 279 10*3/uL (ref 150–400)
RBC: 4.36 MIL/uL (ref 4.22–5.81)
RDW: 13.5 % (ref 11.5–15.5)
WBC: 4.1 10*3/uL (ref 4.0–10.5)
nRBC: 0 % (ref 0.0–0.2)

## 2023-01-04 LAB — COMPREHENSIVE METABOLIC PANEL
ALT: 29 U/L (ref 0–44)
AST: 35 U/L (ref 15–41)
Albumin: 4.1 g/dL (ref 3.5–5.0)
Alkaline Phosphatase: 47 U/L (ref 38–126)
Anion gap: 11 (ref 5–15)
BUN: 15 mg/dL (ref 6–20)
CO2: 23 mmol/L (ref 22–32)
Calcium: 8.7 mg/dL — ABNORMAL LOW (ref 8.9–10.3)
Chloride: 102 mmol/L (ref 98–111)
Creatinine, Ser: 0.9 mg/dL (ref 0.61–1.24)
GFR, Estimated: >60 mL/min (ref >60)
Glucose, Bld: 78 mg/dL (ref 70–99)
Potassium: 3.4 mmol/L — ABNORMAL LOW (ref 3.5–5.1)
Sodium: 136 mmol/L (ref 135–145)
Total Bilirubin: 0.4 mg/dL (ref 0.3–1.2)
Total Protein: 7.2 g/dL (ref 6.5–8.1)

## 2023-01-04 LAB — RAPID URINE DRUG SCREEN, HOSP PERFORMED
Amphetamines: NOT DETECTED
Barbiturates: NOT DETECTED
Benzodiazepines: NOT DETECTED
Cocaine: POSITIVE — AB
Opiates: NOT DETECTED
Tetrahydrocannabinol: NOT DETECTED

## 2023-01-04 LAB — ETHANOL: Alcohol, Ethyl (B): 246 mg/dL — ABNORMAL HIGH (ref <10)

## 2023-01-04 NOTE — ED Provider Notes (Signed)
[place] EMERGENCY DEPARTMENT AT [facility] Provider Note   CSN: [id number] Arrival date & time: 01/04/23  0124     History  Chief Complaint  Patient presents with   Alcohol Intoxication    Pt bib ems after being found sleeping infront of [employer], ETOH on board. No complaints from pt besides being hugry  EMS Vitals BP 110/82 HR 70  SPO2 99% CBG 91    David Burnett is a 32 y.o. male.  The history is provided by the patient and medical records.  Alcohol Intoxication   32 y.o. M with hx of alcohol abuse, presenting to the ED for intoxication. He was apparently found lying on the sidewalk outside of a [employer].  Denies falling, states he was sleeping. Does have EtOH on board.  His only complaint currently is being hungry.  Home Medications Prior to Admission medications   Medication Sig Start Date End Date Taking? Authorizing Provider  chlordiazePOXIDE (LIBRIUM) 25 MG capsule 50mg  PO TID x 1D, then 25-50mg  PO BID X 1D, then 25-50mg  PO QD X 1D 11/13/22   Fondaw, Wylder S, PA  famotidine (PEPCID) 20 MG tablet Take 1 tablet (20 mg total) by mouth 2 (two) times daily as needed for heartburn or indigestion (chest pain). 11/13/22   Fondaw, Rodrigo Ran, PA  ondansetron (ZOFRAN-ODT) 4 MG disintegrating tablet Take 1 tablet (4 mg total) by mouth every 8 (eight) hours as needed for nausea or vomiting. Patient not taking: Reported on 12/10/2022 01/27/22   Petrucelli, Lelon Mast R, PA-C  thiamine (VITAMIN B1) 100 MG tablet Take 1 tablet (100 mg total) by mouth daily. 12/11/22   Glyn Ade, MD      Allergies    Patient has no known allergies.    Review of Systems   Review of Systems  Constitutional:        EtOH  All other systems reviewed and are negative.   Physical Exam Updated Vital Signs There were no vitals taken for this visit.  Physical Exam Vitals and nursing note reviewed.  Constitutional:      Appearance: He is well-developed.  HENT:     Head:  Normocephalic and atraumatic.  Eyes:     Conjunctiva/sclera: Conjunctivae normal.     Pupils: Pupils are equal, round, and reactive to light.  Cardiovascular:     Rate and Rhythm: Normal rate and regular rhythm.     Heart sounds: Normal heart sounds.  Pulmonary:     Effort: Pulmonary effort is normal.     Breath sounds: Normal breath sounds.  Abdominal:     General: Bowel sounds are normal.     Palpations: Abdomen is soft.  Musculoskeletal:        General: Normal range of motion.     Cervical back: Normal range of motion.  Skin:    General: Skin is warm and dry.  Neurological:     Mental Status: He is alert and oriented to person, place, and time.     ED Results / Procedures / Treatments   Labs (all labs ordered are listed, but only abnormal results are displayed) Labs Reviewed  CBC WITH DIFFERENTIAL/PLATELET - Abnormal; Notable for the following components:      Result Value   Neutro Abs 1.6 (*)    All other components within normal limits  COMPREHENSIVE METABOLIC PANEL - Abnormal; Notable for the following components:   Potassium 3.4 (*)    Calcium 8.7 (*)    All other components within normal  limits  ETHANOL - Abnormal; Notable for the following components:   Alcohol, Ethyl (B) 246 (*)    All other components within normal limits  RAPID URINE DRUG SCREEN, HOSP PERFORMED - Abnormal; Notable for the following components:   Cocaine POSITIVE (*)    All other components within normal limits    EKG None  Radiology No results found.  Procedures Procedures    Medications Ordered in ED Medications - No data to display  ED Course/ Medical Decision Making/ A&P                                 Medical Decision Making Amount and/or Complexity of Data Reviewed Labs: ordered. ECG/medicine tests: ordered and independent interpretation performed.   32 year old male presenting to the ED after being found lying on the sidewalk outside of a [employer].  Reported to EMS  that he was sleeping.  His exam is atraumatic, does appear intoxicated.  Hx of same.  Will check labs, monitor.  Labs as above-- ethanol 246.  UDS + for cocaine.  No leukocytosis or significant electrolyte derangement.  6:05 AM Patient reassessed-- still quite drowsy, not really able to stay awake to answer questions.  Will continue to metabolize.  Suspect he can be discharged once clinically sober.  Care will be signed out to oncoming provider for disposition.  Final Clinical Impression(s) / ED Diagnoses Final diagnoses:  Alcoholic intoxication without complication [facility])    Rx / DC Orders ED Discharge Orders     None         Garlon Hatchet, PA-C 01/04/23 4132    Nira Conn, MD 01/04/23 [phone]

## 2023-01-04 NOTE — Discharge Instructions (Addendum)
Drink responsibly.  Return for new or worsening symptoms

## 2023-01-04 NOTE — ED Provider Notes (Signed)
Care assumed from previous provider.  See note for full HPI.  In summation 32 year old here for evaluation of EtOH intoxication.  Found sleeping from a [employer].  EMS was called who subsequently sent here.  Plan on allowing patient to metabolize if at baseline DC home Physical Exam  BP 106/60 (BP Location: Right Arm)   Pulse 78   Temp 97.8 F (36.6 C) (Oral)   Resp 16   SpO2 100%   Physical Exam Vitals and nursing note reviewed.  Constitutional:      General: He is not in acute distress.    Appearance: He is well-developed. He is not ill-appearing or diaphoretic.  HENT:     Head: Atraumatic.  Eyes:     Pupils: Pupils are equal, round, and reactive to light.  Cardiovascular:     Rate and Rhythm: Normal rate and regular rhythm.  Pulmonary:     Effort: Pulmonary effort is normal. No respiratory distress.  Abdominal:     General: There is no distension.     Palpations: Abdomen is soft.  Musculoskeletal:        General: Normal range of motion.     Cervical back: Normal range of motion and neck supple.  Skin:    General: Skin is warm and dry.  Neurological:     General: No focal deficit present.     Mental Status: He is alert and oriented to person, place, and time.     Procedures  Procedures Labs Reviewed  CBC WITH DIFFERENTIAL/PLATELET - Abnormal; Notable for the following components:      Result Value   Neutro Abs 1.6 (*)    All other components within normal limits  COMPREHENSIVE METABOLIC PANEL - Abnormal; Notable for the following components:   Potassium 3.4 (*)    Calcium 8.7 (*)    All other components within normal limits  ETHANOL - Abnormal; Notable for the following components:   Alcohol, Ethyl (B) 246 (*)    All other components within normal limits  RAPID URINE DRUG SCREEN, HOSP PERFORMED - Abnormal; Notable for the following components:   Cocaine POSITIVE (*)    All other components within normal limits   No results found.  ED Course / MDM    Clinical Course as of 01/04/23 8413  Wynelle Link Jan 04, 2023  [address] [place], [place] until sober and dc [BH]    Clinical Course User Index [BH] Teyonna Plaisted A, PA-C    Care assumed from previous provider.  See note for full HPI.  In summation 32 year old here for evaluation of EtOH intoxication.  Found sleeping from a [employer].  EMS was called who subsequently sent here.  Plan on allowing patient to metabolize if at baseline DC home  Patient reassessed.  Sleeping in room.  Lights turned on.  Awakens to loud voice.  Discussed with patient p.o. challenge, ambulate and DC home.  Patient is seen quite frequently in the emergency department for EtOH intoxication.  Discussed responsible drinking.  He has no evidence of traumatic injuries on exam.  No evidence of withdrawal.  DC home symptomatic management.  Patient ambulatory in hall without difficulty. Tolerating PO intake  Medical Decision Making Amount and/or Complexity of Data Reviewed Independent Historian: EMS External Data Reviewed: labs and notes. Labs: ordered. Decision-making details documented in ED Course.  Risk OTC drugs. Diagnosis or treatment significantly limited by social determinants of health.     Alcohol intoxication     Kartik Fernando A, PA-C 01/04/23 1027  Maia Plan, MD 01/05/23 [phone]

## 2023-01-16 ENCOUNTER — Emergency Department (HOSPITAL_COMMUNITY): Payer: BLUE CROSS/BLUE SHIELD

## 2023-01-16 ENCOUNTER — Emergency Department (HOSPITAL_COMMUNITY)
Admission: EM | Admit: 2023-01-16 | Discharge: 2023-01-16 | Disposition: A | Discharge status: Home / Self Care | Payer: BLUE CROSS/BLUE SHIELD | Source: Home / Self Care | Attending: Emergency Medicine | Admitting: Emergency Medicine

## 2023-01-16 ENCOUNTER — Encounter (HOSPITAL_COMMUNITY): Payer: Self-pay

## 2023-01-16 ENCOUNTER — Other Ambulatory Visit: Payer: Self-pay

## 2023-01-16 DIAGNOSIS — S43401A Unspecified sprain of right shoulder joint, initial encounter: Secondary | ICD-10-CM

## 2023-01-16 DIAGNOSIS — W1839XA Other fall on same level, initial encounter: Secondary | ICD-10-CM | POA: Insufficient documentation

## 2023-01-16 DIAGNOSIS — S4991XA Unspecified injury of right shoulder and upper arm, initial encounter: Secondary | ICD-10-CM | POA: Diagnosis present

## 2023-01-16 MED ORDER — IBUPROFEN 200 MG PO TABS
600.0000 mg | ORAL_TABLET | Freq: Once | ORAL | Status: AC
  Administered 2023-01-16: 600 mg via ORAL
  Filled 2023-01-16: qty 3

## 2023-01-16 MED ORDER — ACETAMINOPHEN 500 MG PO TABS
1000.0000 mg | ORAL_TABLET | Freq: Once | ORAL | Status: AC
  Administered 2023-01-16: 1000 mg via ORAL
  Filled 2023-01-16: qty 2

## 2023-01-16 NOTE — ED Triage Notes (Addendum)
Arrives EMS from [place] after calling out saying he fell and hurt his shoulder.   No obvious deformity.   Alcohol intoxication.

## 2023-01-16 NOTE — Discharge Instructions (Signed)
You were seen in the emergency department for shoulder pain after your fall.  Your x-ray showed no broken bones or dislocations.  You likely sprained your shoulder.  You can take Tylenol and Motrin as needed for pain and you should ice your shoulder 15 to 20 minutes at a time several times a day.  You should try to range her shoulder to prevent it from becoming stiff.  You can follow-up with your primary doctor to have your symptoms rechecked and you can follow-up with orthopedics as needed.  You should return to the emergency department if you develop numbness or weakness in your arm or if you have any other new or concerning symptoms.

## 2023-01-16 NOTE — ED Provider Notes (Signed)
[facility] AT [facility] Provider Note   CSN: [id number] Arrival date & time: 01/16/23  0421     History  Chief Complaint  Patient presents with   Shoulder Pain    David Burnett is a 32 y.o. male.  Patient is a 32 year old male with a past medical history of alcohol use disorder presenting to the emergency department with shoulder pain.  The patient states that he was sitting on a ledge last night and accidentally fell forward and landed on his right shoulder.  He denies hitting his head or losing consciousness.  He states that he has had significant pain in his shoulder and decreased ROM secondary to the pain since he fell.  He denies any numbness or weakness.  He denies any other injuries.  He does report that he was drinking last night.  The history is provided by the patient.  Shoulder Pain      Home Medications Prior to Admission medications   Medication Sig Start Date End Date Taking? Authorizing Provider  chlordiazePOXIDE (LIBRIUM) 25 MG capsule 50mg  PO TID x 1D, then 25-50mg  PO BID X 1D, then 25-50mg  PO QD X 1D 11/13/22   Fondaw, Wylder S, PA  famotidine (PEPCID) 20 MG tablet Take 1 tablet (20 mg total) by mouth 2 (two) times daily as needed for heartburn or indigestion (chest pain). 11/13/22   Fondaw, Rodrigo Ran, PA  ondansetron (ZOFRAN-ODT) 4 MG disintegrating tablet Take 1 tablet (4 mg total) by mouth every 8 (eight) hours as needed for nausea or vomiting. Patient not taking: Reported on 12/10/2022 01/27/22   Petrucelli, Lelon Mast R, PA-C  thiamine (VITAMIN B1) 100 MG tablet Take 1 tablet (100 mg total) by mouth daily. 12/11/22   Glyn Ade, MD      Allergies    Patient has no known allergies.    Review of Systems   Review of Systems  Physical Exam Updated Vital Signs BP 114/86 (BP Location: Left Arm)   Pulse 79   Temp 98.7 F (37.1 C) (Oral)   Resp 18   Ht 5\' 6"  (1.676 m)   Wt 63.5 kg   SpO2 100%   BMI 22.60 kg/m   Physical Exam Vitals and nursing note reviewed.  Constitutional:      General: He is not in acute distress.    Appearance: Normal appearance.  HENT:     Head: Normocephalic and atraumatic.     Nose: Nose normal.     Mouth/Throat:     Mouth: Mucous membranes are moist.  Eyes:     Extraocular Movements: Extraocular movements intact.  Cardiovascular:     Rate and Rhythm: Normal rate.     Pulses: Normal pulses.  Pulmonary:     Effort: Pulmonary effort is normal.  Abdominal:     General: Abdomen is flat.  Musculoskeletal:     Cervical back: Normal range of motion.     Comments: Tenderness to palpation over right AC joint and right trapezius muscle with decreased shoulder abduction secondary to pain, no right elbow or wrist tenderness with elbow and wrist ROM intact  Skin:    General: Skin is warm and dry.     Findings: No bruising.  Neurological:     General: No focal deficit present.     Mental Status: He is alert and oriented to person, place, and time.     Sensory: No sensory deficit.     Motor: No weakness.  Psychiatric:  Mood and Affect: Mood normal.        Behavior: Behavior normal.     ED Results / Procedures / Treatments   Labs (all labs ordered are listed, but only abnormal results are displayed) Labs Reviewed - No data to display  EKG None  Radiology DG Shoulder Right  Result Date: 01/16/2023 CLINICAL DATA:  Fall with right shoulder pain EXAM: RIGHT SHOULDER - 2 VIEW COMPARISON:  None Available. FINDINGS: There is no evidence of fracture or dislocation. There is no evidence of arthropathy or other focal bone abnormality. Soft tissues are unremarkable. IMPRESSION: No acute fracture or dislocation. Electronically Signed   By: Agustin Cree M.D.   On: 01/16/2023 09:14    Procedures Procedures    Medications Ordered in ED Medications  acetaminophen (TYLENOL) tablet 1,000 mg (1,000 mg Oral Given 01/16/23 0853)  ibuprofen (ADVIL) tablet 600 mg (600 mg Oral  Given 01/16/23 0853)    ED Course/ Medical Decision Making/ A&P Clinical Course as of 01/16/23 0957  Fri Jan 16, 2023  0932 No traumatic injury seen on XR. Likely shoulder sprain. He is stable for discharge home with outpatient follow up. [VK]    Clinical Course User Index [VK] Rexford Maus, DO                                 Medical Decision Making This patient presents to the ED with chief complaint(s) of shoulder pain with pertinent past medical history of alcohol use disorder which further complicates the presenting complaint. The complaint involves an extensive differential diagnosis and also carries with it a high risk of complications and morbidity.    The differential diagnosis includes fracture, dislocation, sprain, he is neurovascularly intact making neurovascular injury unlikely  Additional history obtained: Additional history obtained from N/A Records reviewed multiple recent ED visits for ETOH intoxication  ED Course and Reassessment: On patient's initial arrival to the emergency department he was notably intoxicated but in no acute distress.  The patient has been in the waiting room for 4 hours prior to my evaluation and appears clinically sober to me without any signs of withdrawal.  He is neurovascularly intact with tenderness to palpation of the right shoulder and decreased ROM.  He will have an x-ray to evaluate for fracture dislocation and was given Tylenol and Motrin for pain and will be closely reassessed.  Independent labs interpretation:  N/A  Independent visualization of imaging: - I independently visualized the following imaging with scope of interpretation limited to determining acute life threatening conditions related to emergency care: R shoulder XR, which revealed no acute traumatic injury  Consultation: - Consulted or discussed management/test interpretation w/ external professional: N/A  Consideration for admission or further workup: Patient  has no emergent conditions requiring admission or further work-up at this time and is stable for discharge home with primary care follow-up  Social Determinants of health: ETOH use disorder    Amount and/or Complexity of Data Reviewed Radiology: ordered.  Risk OTC drugs.          Final Clinical Impression(s) / ED Diagnoses Final diagnoses:  Sprain of right shoulder, unspecified shoulder sprain type, initial encounter    Rx / DC Orders ED Discharge Orders     None         Rexford Maus, DO 01/16/23 8657

## 2023-11-11 IMAGING — CT CT HEAD W/O CM
3 series · 14 of 47 positions shown, 16 images · non-contrast
Comparison: March 20, 2017

CLINICAL DATA: Patient found down.



[Series 4: head wo · axial · 0.50mm/px · z∈[-180,-45]mm · 8 of 33 slices shown, 10 images]
[im 3/33  brain]
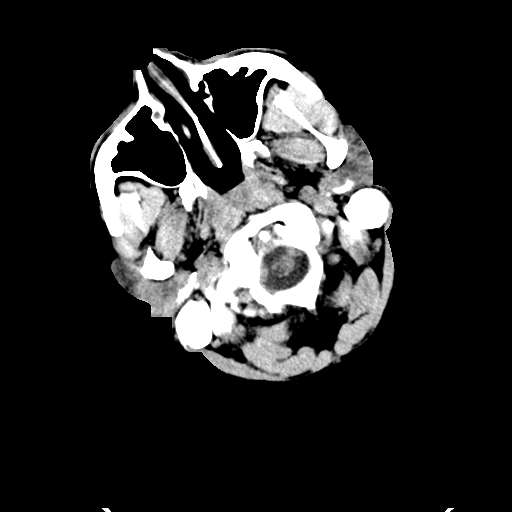
[im 3/33  bone]
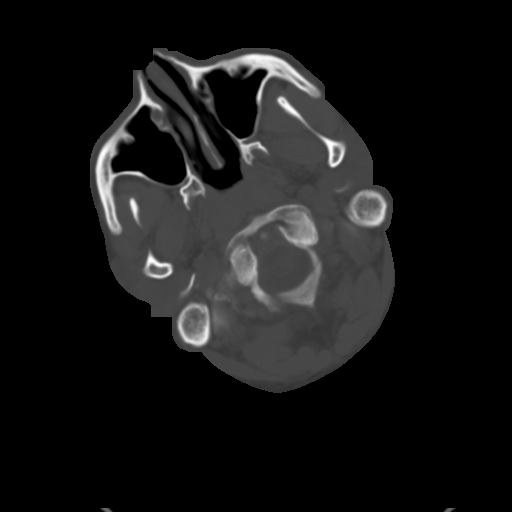
[im 7/33  brain]
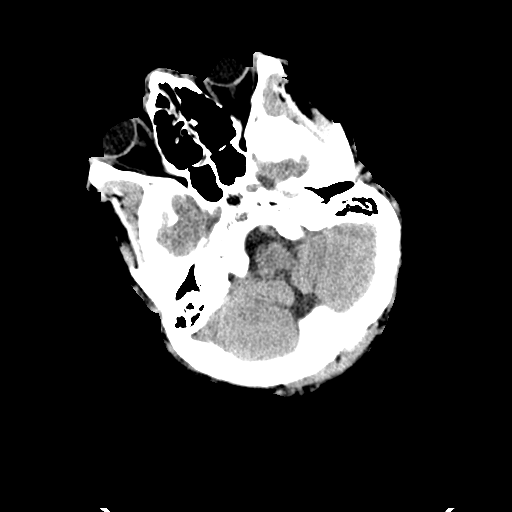
[im 10/33  brain]
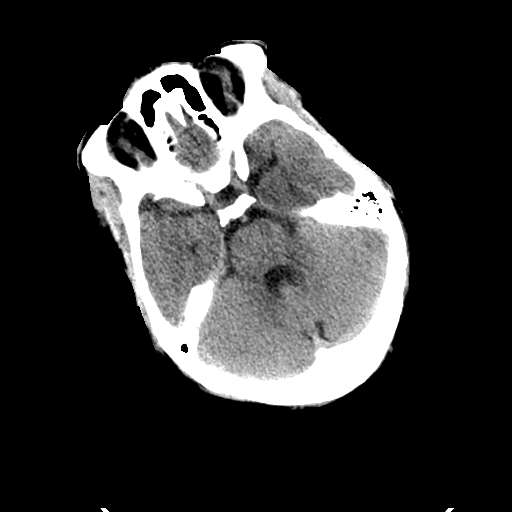
[im 15/33  brain]
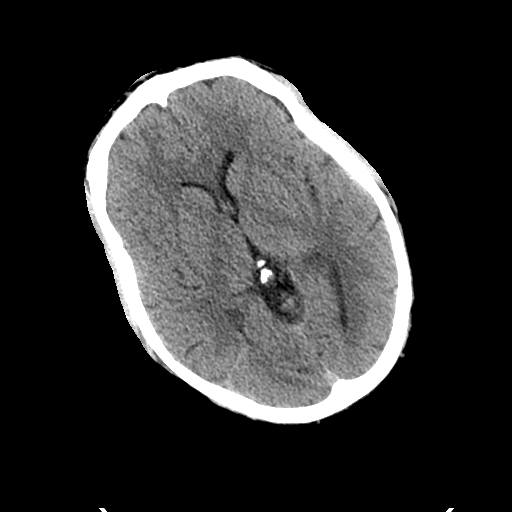
[im 18/33  brain]
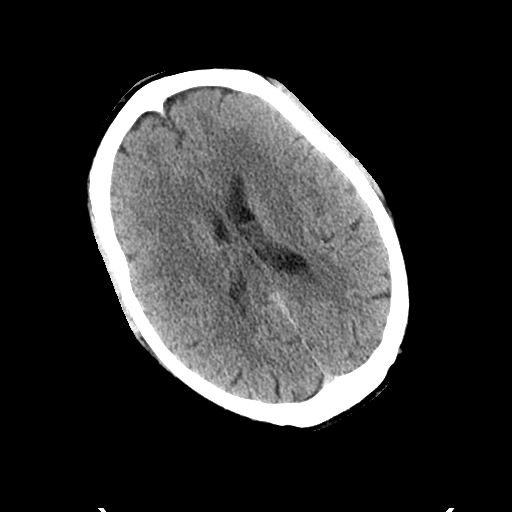
[im 18/33  bone]
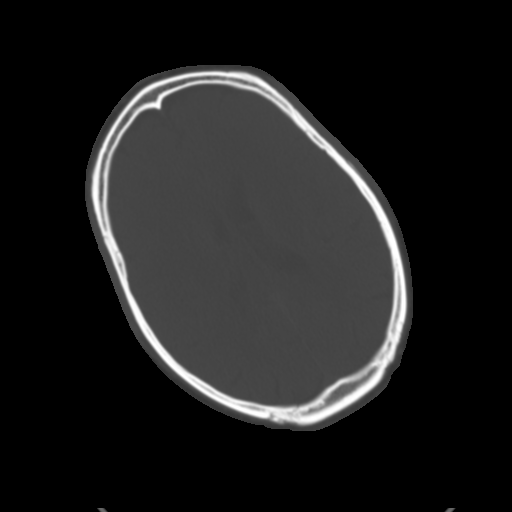
[im 23/33  brain]
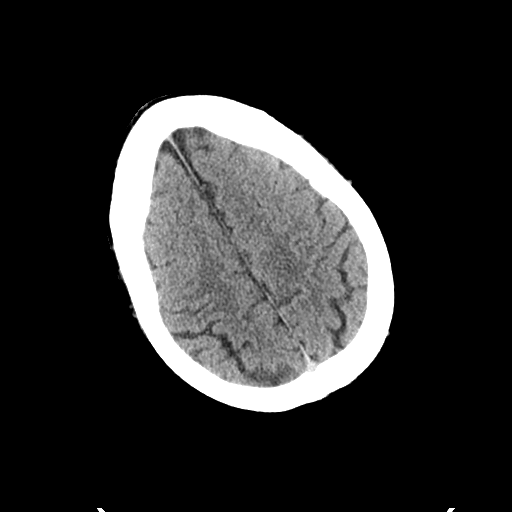
[im 26/33  brain]
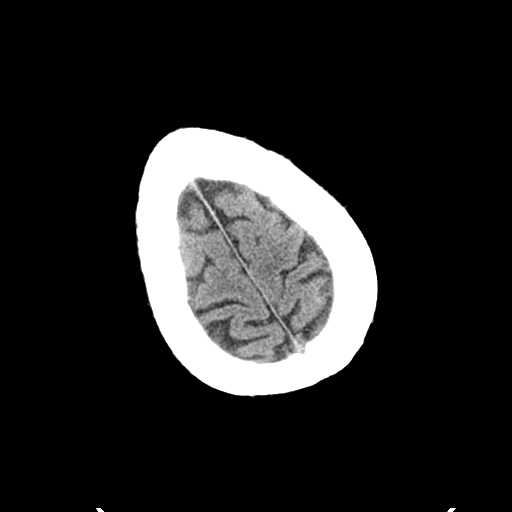
[im 30/33  brain]
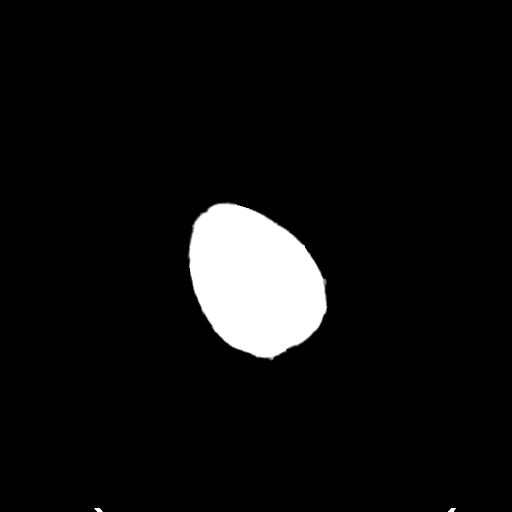

[Series 5: coronal soft tissue · coronal · 0.32mm/px · 3 of 68 slices shown]
[im 23/68  brain]
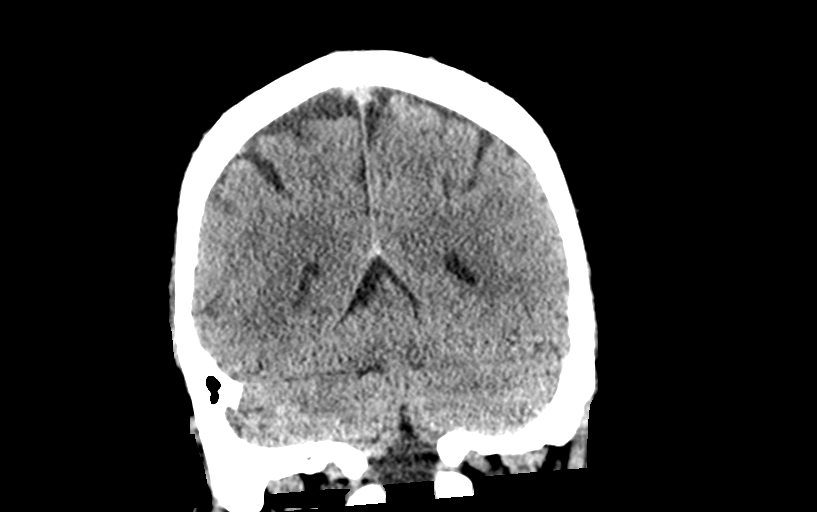
[im 30/68  brain]
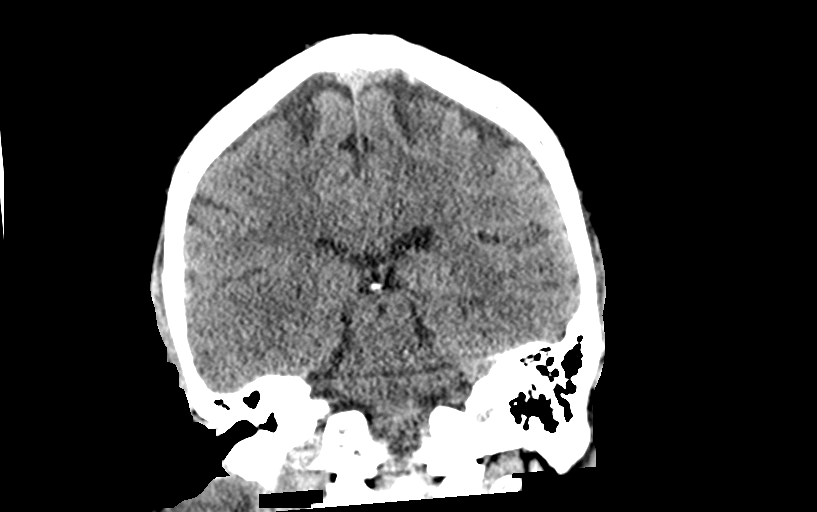
[im 38/68  brain]
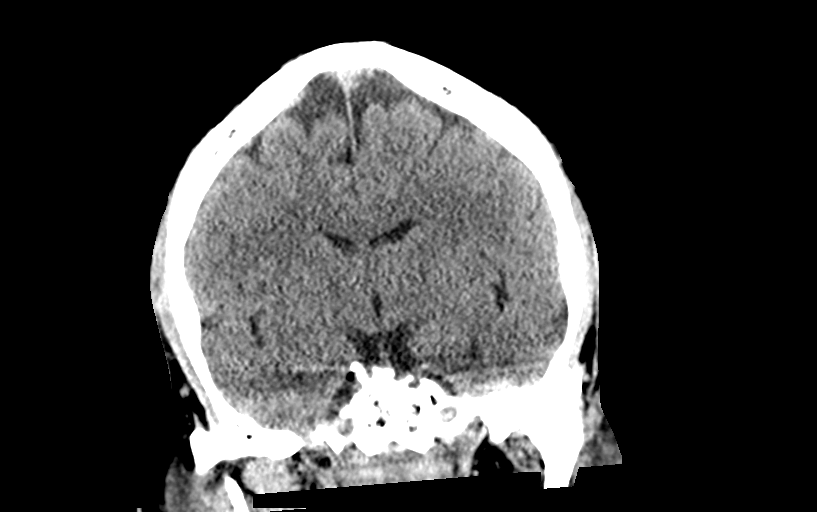

[Series 6: sagittal soft tissue · sagittal · 0.35mm/px · 3 of 53 slices shown]
[im 18/53  brain]
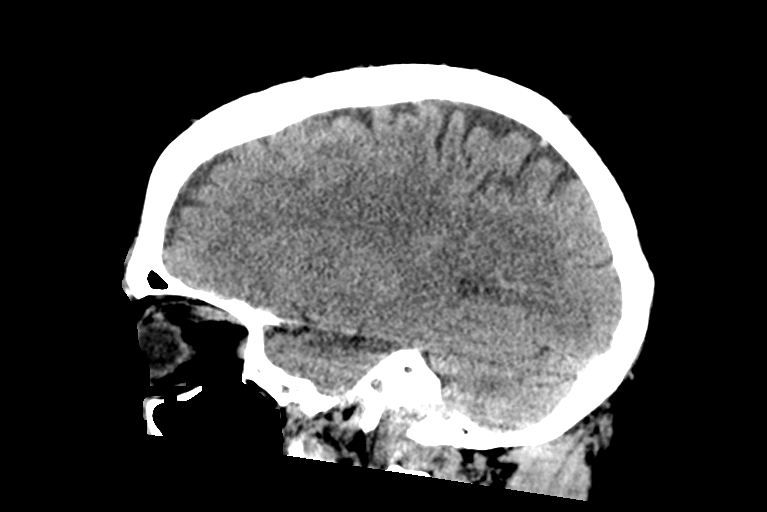
[im 27/53  brain]
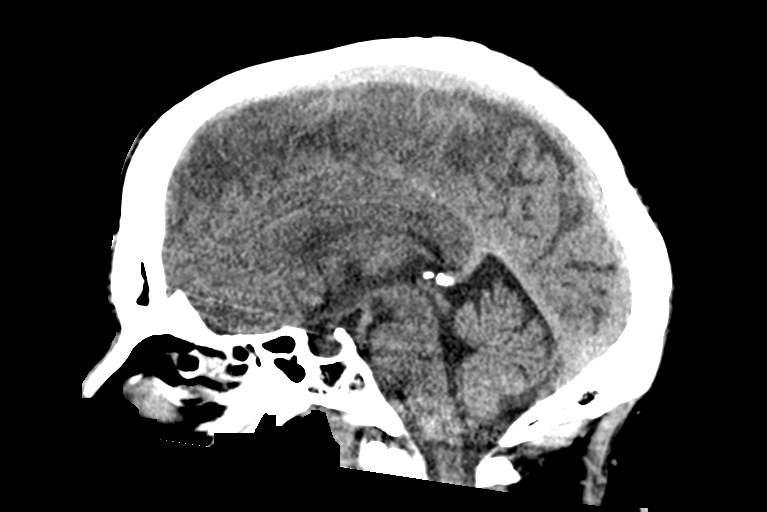
[im 35/53  brain]
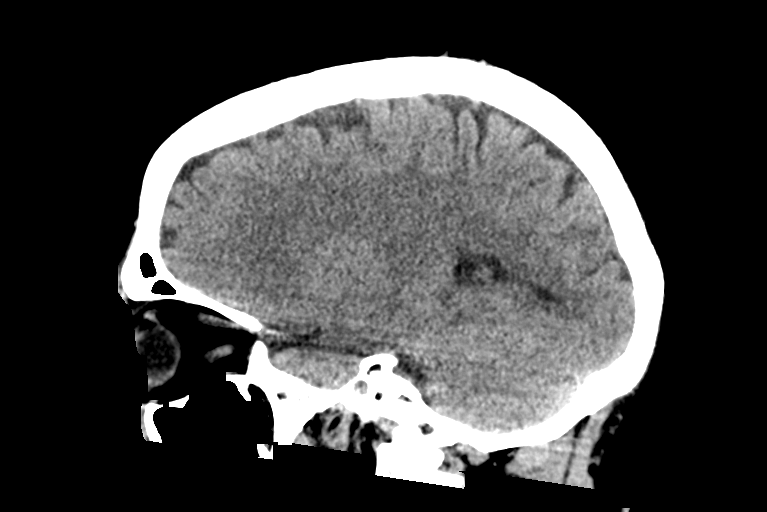

[14 of 47 positions shown; findings below may reference images not displayed]

FINDINGS: Brain: No evidence of acute infarction, hemorrhage, hydrocephalus,
extra-axial collection or mass lesion/mass effect.

Vascular: No hyperdense vessel or unexpected calcification.

Skull: Normal. Negative for fracture or focal lesion.

Sinuses/Orbits: There is mild anterior right ethmoid sinus mucosal
thickening.

Other: None.
IMPRESSION: No acute intracranial abnormality.

## 2024-01-10 IMAGING — CR DG CHEST 2V
2 series · 2 of 2 positions shown · non-contrast
Comparison: None Available.

CLINICAL DATA: Chest pain and burning.

EXAM:
CHEST - 2 VIEW

[chest pa]
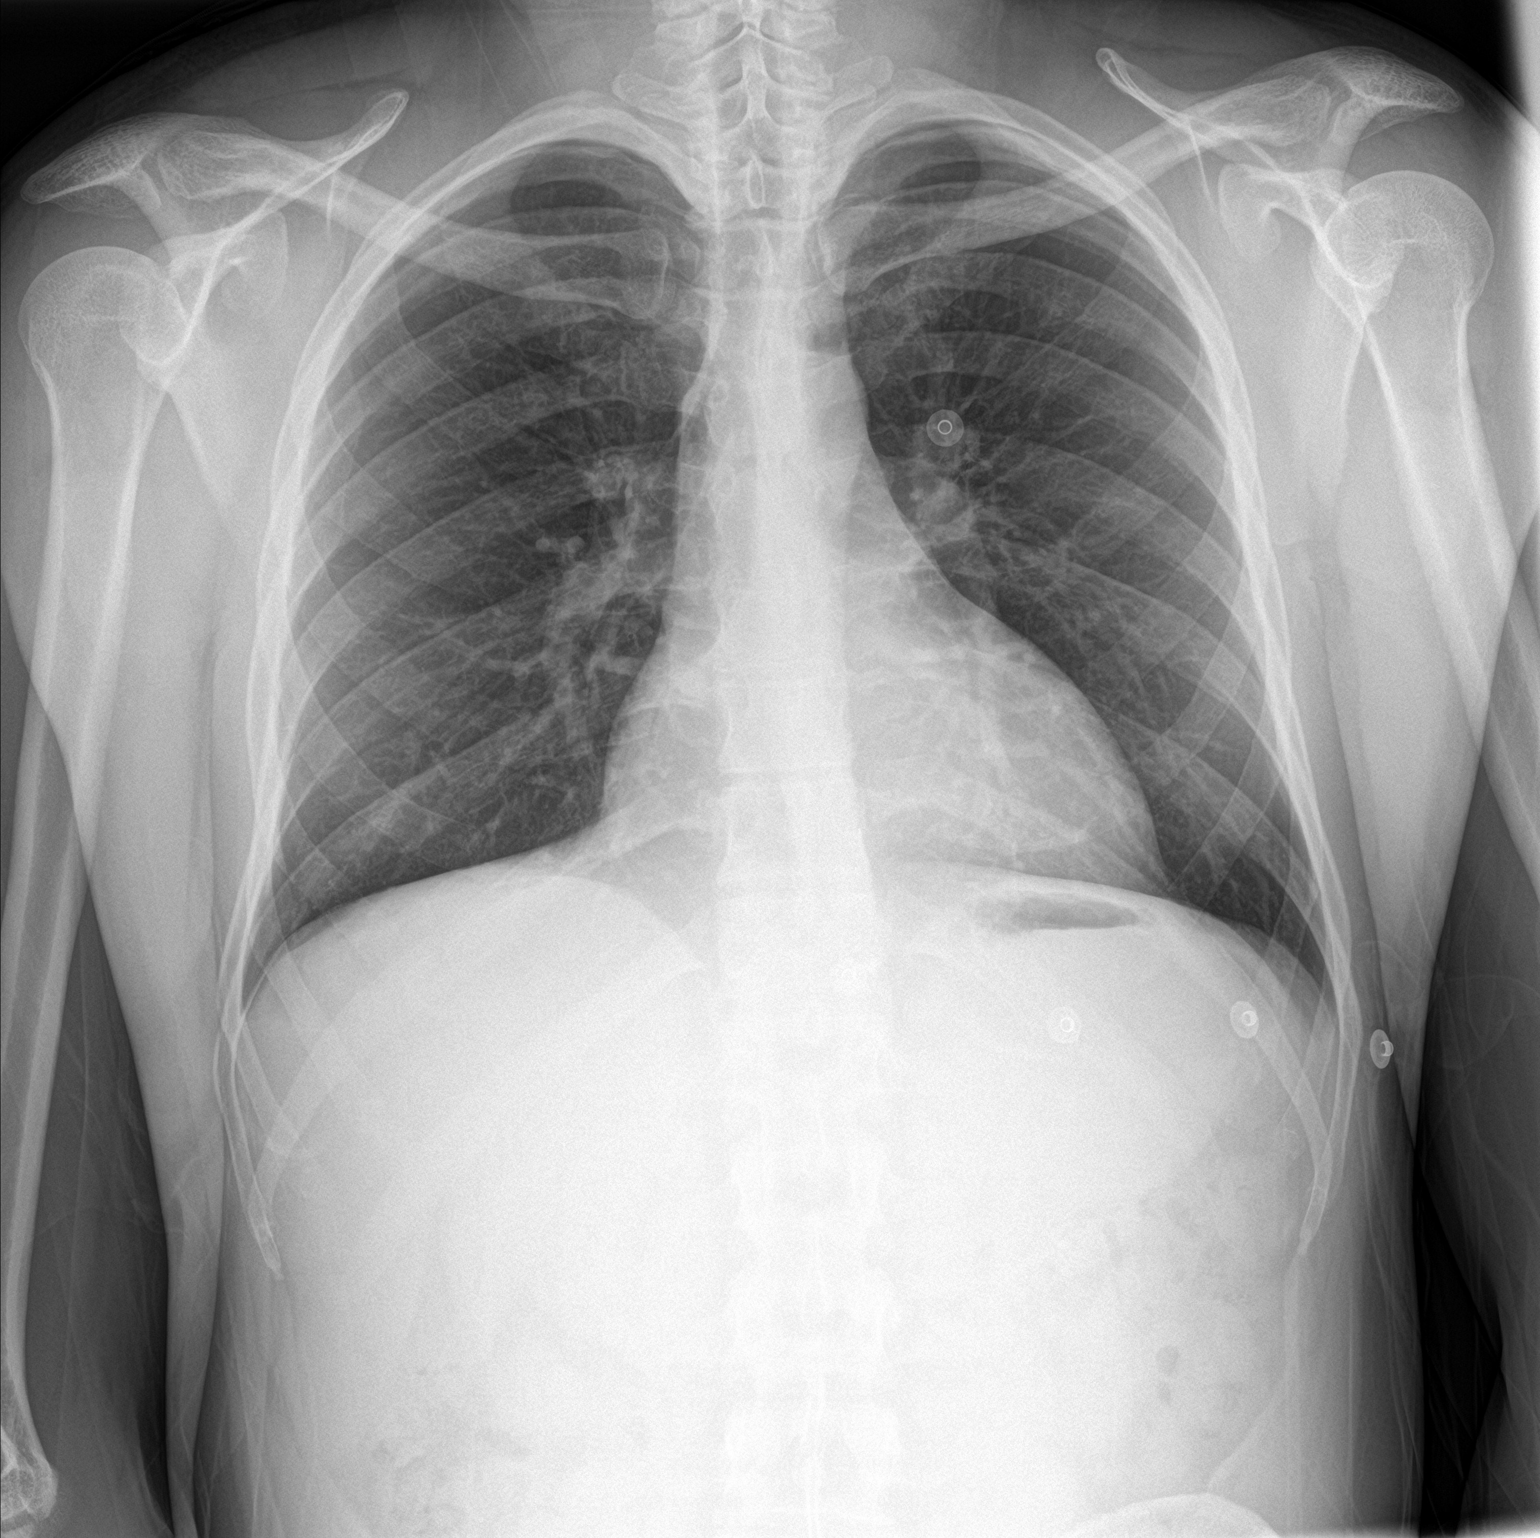

[chest lat]
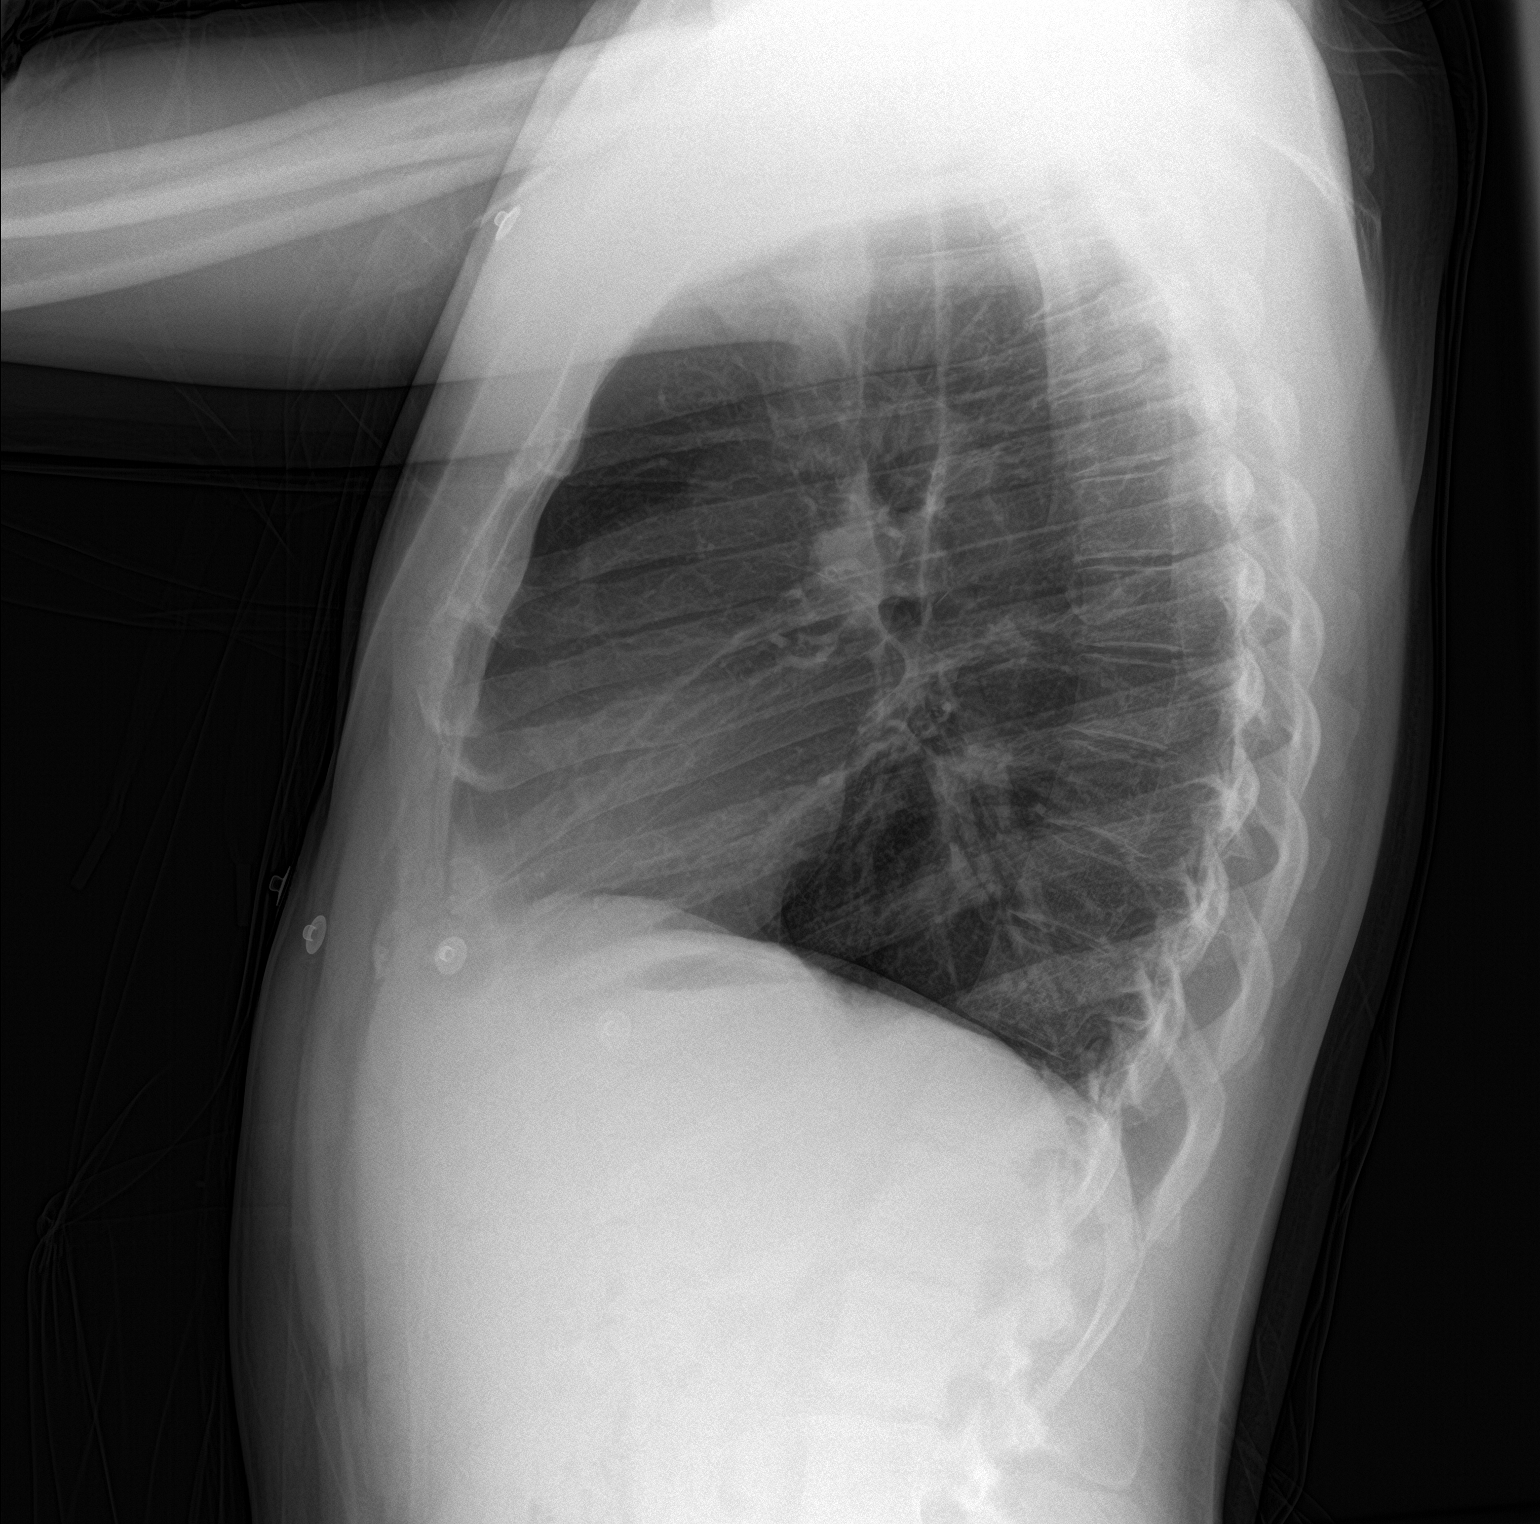

[2 of 2 positions shown; findings below may reference images not displayed]

FINDINGS: The heart size and mediastinal contours are within normal limits.
Both lungs are clear. The visualized skeletal structures are
unremarkable.
IMPRESSION: No active cardiopulmonary disease.  Stable chest.
